# Patient Record
Sex: Male | Born: 1935 | Race: Black or African American | Hispanic: No | Smoking: Never smoker
Health system: Southern US, Community
[De-identification: ages and names within clinical notes are randomized; demographics above are authoritative.]

## PROBLEM LIST (undated history)

## (undated) DIAGNOSIS — C61 Malignant neoplasm of prostate: Secondary | ICD-10-CM

## (undated) DIAGNOSIS — I10 Essential (primary) hypertension: Secondary | ICD-10-CM

## (undated) DIAGNOSIS — Z8739 Personal history of other diseases of the musculoskeletal system and connective tissue: Secondary | ICD-10-CM

## (undated) DIAGNOSIS — I471 Supraventricular tachycardia, unspecified: Secondary | ICD-10-CM

## (undated) DIAGNOSIS — N189 Chronic kidney disease, unspecified: Secondary | ICD-10-CM

## (undated) DIAGNOSIS — I341 Nonrheumatic mitral (valve) prolapse: Secondary | ICD-10-CM

## (undated) DIAGNOSIS — D649 Anemia, unspecified: Secondary | ICD-10-CM

## (undated) DIAGNOSIS — C679 Malignant neoplasm of bladder, unspecified: Secondary | ICD-10-CM

## (undated) HISTORY — DX: Malignant neoplasm of prostate: C61

## (undated) HISTORY — DX: Nonrheumatic mitral (valve) prolapse: I34.1

## (undated) HISTORY — PX: HERNIA REPAIR: SHX51

## (undated) HISTORY — DX: Chronic kidney disease, unspecified: N18.9

## (undated) HISTORY — DX: Anemia, unspecified: D64.9

## (undated) HISTORY — DX: Malignant neoplasm of bladder, unspecified: C67.9

## (undated) HISTORY — DX: Personal history of other diseases of the musculoskeletal system and connective tissue: Z87.39

## (undated) HISTORY — DX: Supraventricular tachycardia: I47.1

## (undated) HISTORY — DX: Essential (primary) hypertension: I10

## (undated) HISTORY — PX: TRANSURETHRAL RESECTION OF PROSTATE: SHX73

## (undated) HISTORY — DX: Supraventricular tachycardia, unspecified: I47.10

## (undated) HISTORY — PX: OTHER SURGICAL HISTORY: SHX169

---

## 2003-06-20 ENCOUNTER — Ambulatory Visit (HOSPITAL_COMMUNITY): Admission: RE | Admit: 2003-06-20 | Discharge: 2003-06-20 | Payer: Self-pay | Admitting: Cardiology

## 2003-08-08 ENCOUNTER — Encounter (INDEPENDENT_AMBULATORY_CARE_PROVIDER_SITE_OTHER): Payer: Self-pay | Admitting: *Deleted

## 2003-08-08 ENCOUNTER — Ambulatory Visit (HOSPITAL_COMMUNITY): Admission: RE | Admit: 2003-08-08 | Discharge: 2003-08-09 | Payer: Self-pay | Admitting: General Surgery

## 2003-10-26 ENCOUNTER — Ambulatory Visit: Admission: RE | Admit: 2003-10-26 | Discharge: 2003-10-26 | Payer: Self-pay | Admitting: Cardiology

## 2005-09-03 ENCOUNTER — Inpatient Hospital Stay (HOSPITAL_COMMUNITY): Admission: EM | Admit: 2005-09-03 | Discharge: 2005-09-17 | Payer: Self-pay | Admitting: Emergency Medicine

## 2005-09-11 ENCOUNTER — Encounter (INDEPENDENT_AMBULATORY_CARE_PROVIDER_SITE_OTHER): Payer: Self-pay | Admitting: *Deleted

## 2006-01-19 ENCOUNTER — Emergency Department (HOSPITAL_COMMUNITY): Admission: EM | Admit: 2006-01-19 | Discharge: 2006-01-19 | Payer: Self-pay | Admitting: *Deleted

## 2006-01-23 ENCOUNTER — Encounter (INDEPENDENT_AMBULATORY_CARE_PROVIDER_SITE_OTHER): Payer: Self-pay | Admitting: Specialist

## 2006-01-24 ENCOUNTER — Inpatient Hospital Stay (HOSPITAL_COMMUNITY): Admission: RE | Admit: 2006-01-24 | Discharge: 2006-01-25 | Payer: Self-pay | Admitting: Urology

## 2006-04-08 ENCOUNTER — Ambulatory Visit (HOSPITAL_COMMUNITY): Admission: RE | Admit: 2006-04-08 | Discharge: 2006-04-08 | Payer: Self-pay | Admitting: Ophthalmology

## 2006-12-16 ENCOUNTER — Ambulatory Visit: Payer: Self-pay | Admitting: Cardiology

## 2006-12-16 ENCOUNTER — Encounter: Payer: Self-pay | Admitting: Urology

## 2006-12-16 ENCOUNTER — Inpatient Hospital Stay (HOSPITAL_COMMUNITY): Admission: AD | Admit: 2006-12-16 | Discharge: 2006-12-17 | Payer: Self-pay | Admitting: Cardiology

## 2006-12-17 ENCOUNTER — Encounter: Payer: Self-pay | Admitting: Cardiology

## 2007-04-09 ENCOUNTER — Encounter: Payer: Self-pay | Admitting: Urology

## 2007-04-09 ENCOUNTER — Ambulatory Visit (HOSPITAL_COMMUNITY): Admission: RE | Admit: 2007-04-09 | Discharge: 2007-04-10 | Payer: Self-pay | Admitting: Urology

## 2007-06-22 ENCOUNTER — Encounter (HOSPITAL_COMMUNITY): Admission: RE | Admit: 2007-06-22 | Discharge: 2007-07-01 | Payer: Self-pay | Admitting: Nephrology

## 2007-10-02 IMAGING — CR DG CHEST 1V PORT
1 series · 1 of 1 positions shown · non-contrast
Comparison: 08/04/03.

CLINICAL DATA: 82-year-old male, renal failure, swelling. 
 PORTABLE CHEST ? 1 VIEW:

[view not recorded]
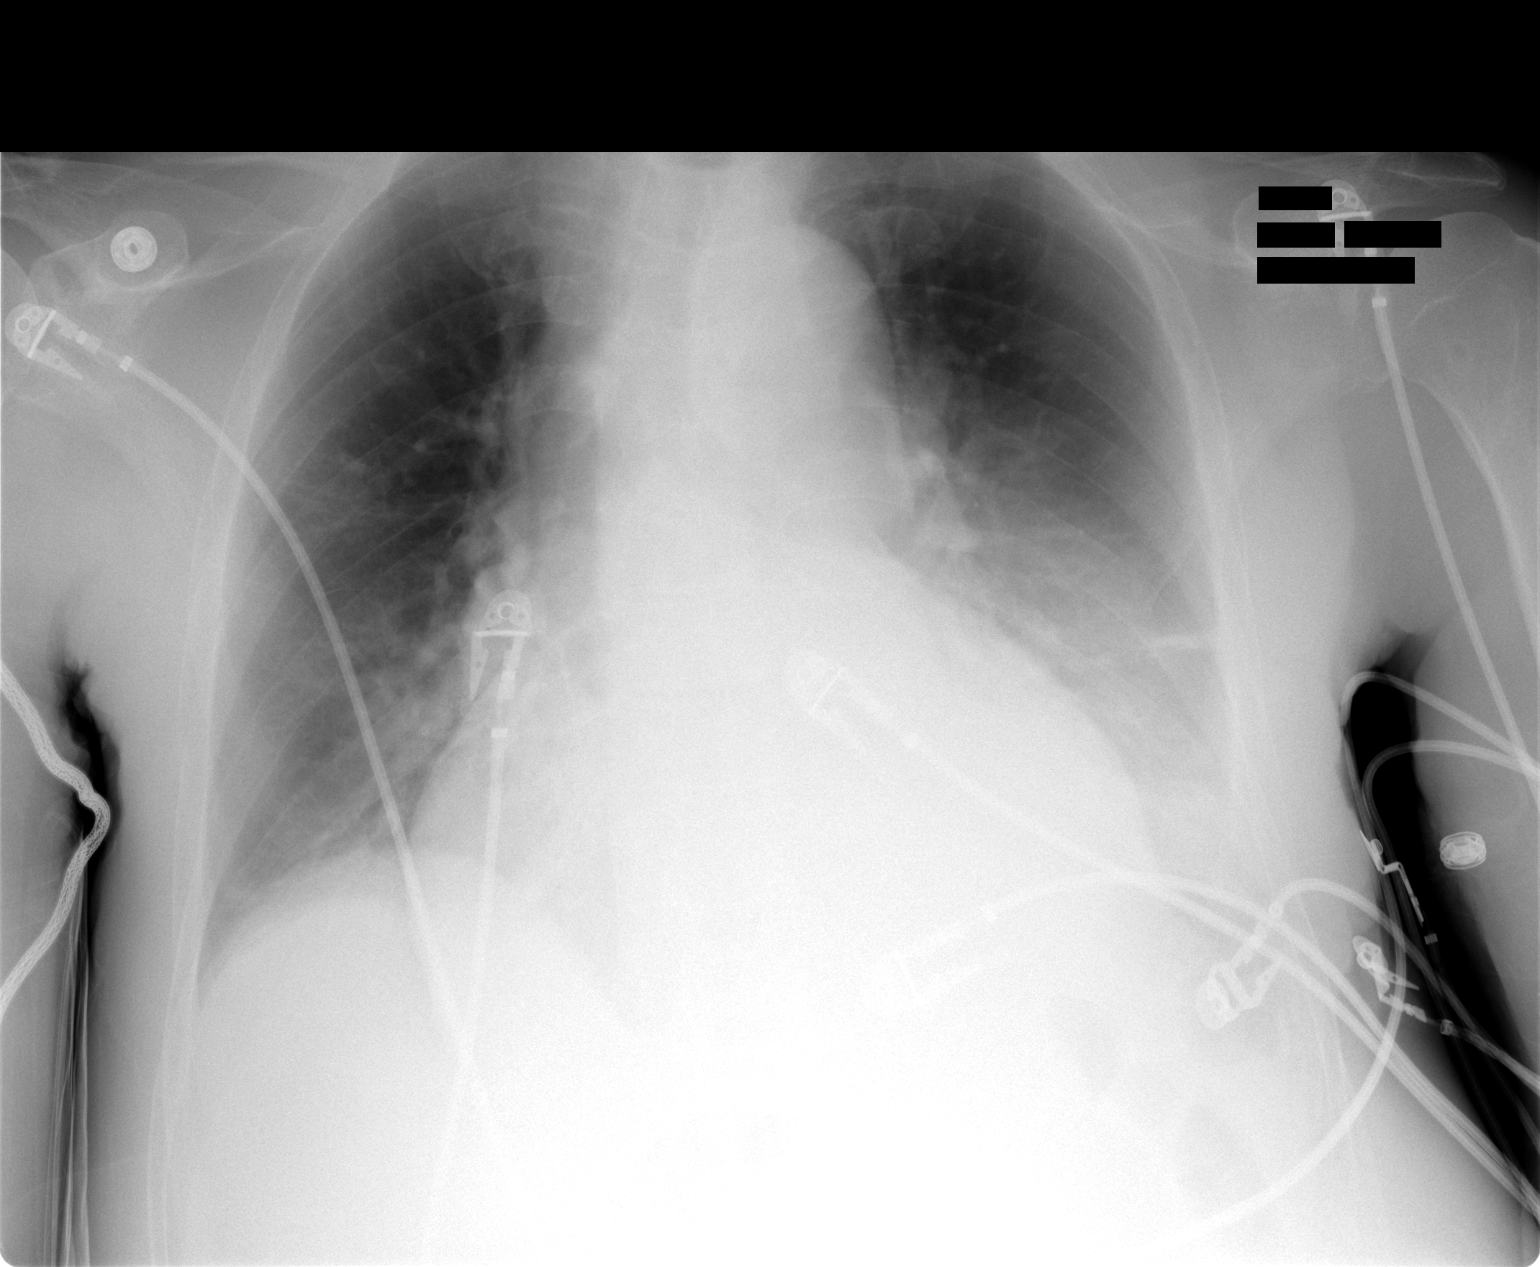

[1 of 1 positions shown; findings below may reference images not displayed]

FINDINGS: The heart is enlarged with central vascular congestion and left lower lobe atelectasis/consolidation.  Minimal right base atelectasis.  No significant edema, effusion, or pneumothorax.
IMPRESSION: 1.  Cardiomegaly with vascular congestion. 
 2.  Left lower lobe atelectasis/consolidation.  Pneumonia is not excluded.  
 3.  Right base atelectasis.

## 2008-05-27 ENCOUNTER — Ambulatory Visit (HOSPITAL_COMMUNITY): Admission: RE | Admit: 2008-05-27 | Discharge: 2008-05-27 | Payer: Self-pay | Admitting: Ophthalmology

## 2010-08-13 LAB — CBC
HCT: 37 % — ABNORMAL LOW (ref 39.0–52.0)
Hemoglobin: 12.5 g/dL — ABNORMAL LOW (ref 13.0–17.0)
MCHC: 33.8 g/dL (ref 30.0–36.0)
MCV: 96 fL (ref 78.0–100.0)
Platelets: 139 10*3/uL — ABNORMAL LOW (ref 150–400)
RDW: 12.6 % (ref 11.5–15.5)

## 2010-08-13 LAB — BASIC METABOLIC PANEL
BUN: 34 mg/dL — ABNORMAL HIGH (ref 6–23)
CO2: 33 mEq/L — ABNORMAL HIGH (ref 19–32)
Glucose, Bld: 109 mg/dL — ABNORMAL HIGH (ref 70–99)
Potassium: 3.9 mEq/L (ref 3.5–5.1)
Sodium: 140 mEq/L (ref 135–145)

## 2010-08-13 LAB — URINE MICROSCOPIC-ADD ON

## 2010-08-13 LAB — URINALYSIS, ROUTINE W REFLEX MICROSCOPIC
Bilirubin Urine: NEGATIVE
Hgb urine dipstick: NEGATIVE
Ketones, ur: NEGATIVE mg/dL
Protein, ur: NEGATIVE mg/dL
Urobilinogen, UA: 0.2 mg/dL (ref 0.0–1.0)

## 2010-09-11 NOTE — H&P (Signed)
Shawn Booker, Shawn Booker               ACCOUNT NO.:  [id number]   MEDICAL RECORD NO.:  [id number]          PATIENT TYPE:  INP   LOCATION:  2902                         FACILITY:  [facility]   PHYSICIAN:  Gerrit Friends. Dietrich Pates, MD, FACCDATE OF BIRTH:  Sep 23, 1922   DATE OF ADMISSION:  12/16/2006  DATE OF DISCHARGE:                              HISTORY & PHYSICAL   The patient is FULL CODE.   PRIMARY CARE PHYSICIAN:  Dr. Tawnya Crook.   The patient was a good historian.   TOTAL VISIT TIME:  Approximately 60 minutes.   CHIEF COMPLAINT:  Chest pressure and palpitations.   HISTORY OF PRESENT ILLNESS:  Shawn Booker is an 77 year old male with a  history of acute renal failure from bladder obstruction, related to  prostate cancer; history of recent bladder tumor diagnosis, and who  underwent transurethral resection of that tumor on the day of admission.   Presents for evaluation of palpitations and chest pressure on transfer  from [facility].  The patient apparently had a  transurethral prostatectomy for locally invasive adenocarcinoma in  September 2007, having a negative bone scan in August 2007.  Apparently  had a recent relook of the bladder and was found on biopsy apparently to  have a bladder tumor locally.  This was resected on the day of  admission, and postoperatively the patient notes that after 20-30  minutes after eating chest pressure syndrome, feeling like gas mid  sternum and with no radiation; but associated with tachy palpitations.  Notes when hooked up to the monitor his pulse read 139, but his blood  pressure was in the range of 120/70.  He denied any associated nausea or  vomiting, sweats or shortness of breath.  He denies any associated  presyncopal or syncopal symptoms.  The patient notes he felt this way  for approximately 2 hrs.  The patient noted in the emergency room by EKG  to have a tachycardia of 120 beats per minute.  Apparently his heart  rate  decreased into the 70s range spontaneously without any intervention  in the emergency room.   The patient denies having any episodes of this in the past.  He denies  any prior chest pain.  He denies any recent lower extremity edema.  He  denies any paroxysmal nocturnal dyspnea or orthopnea.   PAST MEDICAL HISTORY:  No prior cardiac catheterization.  No prior  coronary artery bypass grafting.  He had a Myoview perfusion spec study,  with a Persantine stress in June 20, 2003; showing ejection fraction  of 59% and no evidence of ischemia.  He has a history of right upper  lung scar by chest x-ray.  History of iron deficiency anemia and  thrombocytopenia; possibly related to chronic kidney disease.  With no  recent thrombocytopenia or anemia.  History of acute renal failure, with  bilateral hydronephrosis, anasarca and related to urethral obstruction  in May 2007.  He has a history of hypertension.  History of a 2/6  holosystolic murmur per old chart.  History of osteopenia.  History of  volume overload.  History of  right eye cataract surgery in December  2007.   ALLERGIES:  HE HAS NO MEDICINE ALLERGIES.  He does not allergies by  testing to MILK, WHEAT, EGGS; but has never had a clinical reaction to  it.  NO CONTRAST ALLERGY.   MEDICATIONS:  1. Procrit injection every 3 weeks.  2. Zemplar 1 mcg daily.  3. Lasix 80 mg by mouth twice a day.  4. Nephrovite daily.  5. Casodex 60 mg by mouth daily.  6. Ultimate meal supplementations daily with meals.  7. Vitamin and calcium supplements.  8. Herbal plant algae 10 days out of the week.  9. Vitamin C 1000 mg every other day.  10.Grape seed 100 mg by mouth twice daily.  11.Just prescribed Keflex postoperatively 500 mg by mouth 3x daily.   SOCIAL HISTORY:  He lives alone.  He is divorced.  A former Runner, broadcasting/film/video in  Psychologist, educational and music.  No history of tobacco ever, or alcohol or illicit drugs.  He is a vegetarian.   FAMILY HISTORY:  He denies any  chronic medical problems in the mother  and the father.  Mother died at 62 and father died at 8.  He has 2  siblings; one brother is 90 and one sister is 33 with no chronic medical  problems.   REVIEW OF SYSTEMS:  CONSTITUTIONAL:  He denies any fevers, with no  history of sweats in the past after his prostate surgery.  HEENT:  He  denies any headaches, sore throat, or nasal discharge.  SKIN:  He denies  any rashes or lesions or nail changes.  CARDIOPULMONARY:  As above.  He  denies any cough, wheezing or claudication.  GU:  He denies any dysuria  or frequency or urgent prior.  He notes after the prostate surgery in  September 2007 he has had no trouble.  NEURO/PSYCHIATRIC:  He denies any  weakness, numbness or mood disturbance. He also denies any joint  swelling or deformities.  GI:  He denies any nausea or vomiting.  He  denies any bright red blood per rectum or melena.  ENDOCRINE:  He does  not polydipsia but no polyuria.   PHYSICAL EXAMINATION:  VITAL SIGNS:  Temperature afebrile with a pulse  of 74, respiratory rate 64, blood pressure 116/74, saturation 100% on 2  liters nasal cannula.  Weight 77 kg.  GENERAL:  He is a male lying flat in bed; looking younger than his  stated age and in no acute distress.  HEENT:  Normocephalic and atraumatic.  Pupils are equal, round and  reactive to light.  Extraocular movements are intact.  Sclerae is clear.  The oropharynx is dry.  Good dentition.  NECK:  Supple, with no lymphadenopathy, thyromegaly or bruits.  Jugular  venous distention is estimated at 7 cm of water.  No anterior cervical  lymphadenopathy.  CARDIOVASCULAR:  Regular rate and rhythm with normal S1 and S2.  He had  a 3/6 holosystolic murmur, loudest at the apex and at the left sternum;  with no changes in intensity of the murmur with inspiration or with  Valsalva maneuvers.  LUNGS:  Clear to auscultation bilaterally.  SKIN:  Showed no rashes or lesions.  ABDOMEN:  Soft and  nontender, nondistended.  No hepatosplenomegaly.  GU:  He has a Foley in place, with serosanguineous urine.  EXTREMITIES:  Show no clubbing, cyanosis or edema.  MUSCULOSKELETAL:  Shows no joint deformities or effusion.  NEUROLOGIC:  He is alert and oriented x4.  Cranial nerves  II-XII are  grossly intact.  Strength and sensation grossly intact.   CHEST X-RAY:  Shows mild cardiomegaly with possible left  hemidiaphragmatic free air.   EKG:  At the time in the emergency room showed a possible  supraventricular tachycardia at the rate of 120.  QRS interval was 92.  QT corrected to 472.  This is new compared to sinus bradycardia that was  evident in August 04, 2003 at a rate of 45.   LABORATORY DATA:  White count 4.1, hemoglobin 12.5, hematocrit 37,  platelets 270.  MCV 95.6.  Sodium 136, potassium 4.3, chloride 101,  bicarb 28, BUN 35, creatinine 2.35 (his baseline was in the range of  2.4), glucose 269 (which is highest reported here at [facility]).  Total  bilirubin 1.1, AST 30, ALT 14, alkaline phosphatase 93.  Total protein  7.4, albumin 3.6, calcium 8.4.  Cardiac markers are 1545 on day of  admission.  CK 129, MB 2.8.  Troponin 0.02.  Venous blood gas:  pH 7.35,  CO2  52.9.   ASSESSMENT AND PLAN:  This is a gentleman with a history of bladder  tumor, postoperatively had an apparent supraventricular tachycardia  episode, and has elevated blood sugars.  Further supraventricular  tachycardia episodes differential diagnoses include:  catecholamine  induced; although this is relatively early.  Or, he did receive  apparently one liter of D5W, which possibly resulted in his elevated  blood sugar.  Unsure if this relates to his arrhythmia.  Other potential  possibilities include pulmonary thromboembolism; although he has no  signs on physical examination or objectively to have this syndrome.  Will also keep coronary syndrome a possibility; also hyperthyroidism  could be a potential  possibility.  For these possibilities we will get  and echocardiogram.  He will be on telemetry.  We will get cardiac  markers x3; and check a TSH, free T4, B-natruretic peptide, magnesium  and a D-dimer.  He will be n.p.o. after midnight, and he will be placed  on aspirin and a low-dose by mouth beta blocker.   For his elevated blood sugars we will follow this with AccuCheks and  check a hemoglobin A1c.  Possibly related to the IV infusions given for  possible mitral regurgitation or murmur.   Will check an echocardiogram in the morning.  He has no apparent  symptoms or longstanding shortness of breath, dyspnea on exertion.   For his recent bladder surgery, we will start the Keflex here and get a  urinalysis and culture -- given his recent SVT.  This may be a sign of  infection.  The Foley is to be removed tomorrow.   For his chronic kidney disease will continue Nephrovite and Lasix.   For his prostate cancer will continue Casodex, GI prophylaxis with  Protonix and DVT prophylaxis with pneumatic compression devices.      Darryl D. Prime, MD   Electronically Signed     ______________________________  Gerrit Friends. Dietrich Pates, MD, [facility]    DDP/MEDQ  D:  12/16/2006  T:  12/17/2006  Job:  [id number]

## 2010-09-11 NOTE — Op Note (Signed)
NAMEDUFF, POZZI               ACCOUNT NO.:  [id number]   MEDICAL RECORD NO.:  [id number]          PATIENT TYPE:  OIB   LOCATION:  1413                         FACILITY:  [facility]   PHYSICIAN:  Terie Purser, MD         DATE OF BIRTH:  Dec 01, 1922   DATE OF PROCEDURE:  04/09/2007  DATE OF DISCHARGE:                               OPERATIVE REPORT   ASSISTANT:  Dr. Allena Katz.   PREOPERATIVE DIAGNOSIS:  Recurrent bladder tumor.   POSTOPERATIVE DIAGNOSIS:  Recurrent bladder tumor.   PROCEDURES:  1. Cystourethroscopy.  2. Urethral dilation.  3. Transurethral resection of bladder tumor (1).   INDICATIONS:  This is an 77 year old gentleman with history of prostate  cancer status post radiation who was found to have a bladder tumor.  This was resected in the past and the patient deferred re-resection at  that time.  On serial cystoscopy was found to have recurrence of bladder  tumor in his right lateral wall and was scheduled today electively for  transurethral resection.   PROCEDURE IN DETAIL:  The patient was brought back in the operating  room.  He was placed in dorsal lithotomy position after the successful  induction of LMA anesthetic.  All pressure points were padded  appropriately and he was prepped and draped in the usual sterile  fashion.  Using a 22-French sheath and 12.5 and 70 degree lenses pan  cystourethroscopy was performed.  The patient's anterior urethra  appeared normal.  The patient's membranous urethra appeared normal.  On  viewing the patient's prostate we could see blanching mucosa consistent  with his history of radiation.  Additionally he had a false pass just  distal to the verumontanum.  The rest of the prostate appeared normal,  on entering his bladder pancystoscopy was performed.  The patient's  bladder had evidence of hypertrophy with cellulae and small diverticula.  Both ureteral orifices were seen and an old scar was seen in the right  resection bed.  Just  superior to this on the right lateral wall of the  bladder a 1 cm bladder tumor was seen.   The cystoscope was removed and we dilated the urethra to 28-French  sequentially from a 22-French with curved Mayo dilators.   Then we inserted the resectoscope under direct vision.  Using the loop  ono cut we took a swipe of the bladder tumor and removed it in its  entirety.  Muscle fibers could be seen in the bed and there was no  evidence of perforation of the bladder.  At this point we coagulated the  edges of the resection bed and the resection site itself to ensure that  there is no bleeding.  The bladder was emptied and we re-examined the  site and there was again no evidence of bleeding.  Both ureteral  orifices were seen in their normal position effluxing urine.  The rest  of the bladder appeared normal.  At this point the resectoscope was  removed.  A 22-French catheter was placed.  Clear urine was seen  emanating from this catheter.  Balloon was  inflated and this ended the  procedure.  Please note that Dr. Annabell Howells was present throughout the  entirety of the case.   ESTIMATED BLOOD LOSS:  Minimal.   URINE OUTPUT:  Unrecorded.   DRAINS:  Foley catheter.   SPECIMENS:  Bladder tumor.   COMPLICATIONS:  None apparent.   DISPOSITION:  The patient will go to PACU for further care.      Terie Purser, MD     JH/MEDQ  D:  04/09/2007  T:  04/09/2007  Job:  [id number]

## 2010-09-11 NOTE — Op Note (Signed)
NAMEHILMAR, MOLDOVAN               ACCOUNT NO.:  [id number]   MEDICAL RECORD NO.:  [id number]          PATIENT TYPE:  AMB   LOCATION:  NESC                         [facility]ACILITY:  [facility]   PHYSICIAN:  Excell Seltzer. Annabell Howells, M.D.    DATE OF BIRTH:  1922-12-05   DATE OF PROCEDURE:  12/16/2006  DATE OF DISCHARGE:                               OPERATIVE REPORT   PROCEDURE:  Cystoscopy with transurethral resection of a medium size  bladder tumor.   PREOPERATIVE DIAGNOSIS:  Bladder tumor, right bladder neck.   POSTOPERATIVE DIAGNOSIS:  Bladder tumor, right bladder neck.   SURGEON:  Excell Seltzer. Annabell Howells, M.D.   ANESTHESIA:  General.   SPECIMEN:  Bladder tumor tissue and the small prostate nodule.   DRAINS:  20-French Foley catheter.   COMPLICATIONS:  None.   INDICATIONS:  Mr. Sherrod is an 77 year old black male with a history of  stage D adenocarcinoma of prostate with a rising PSA despite androgen  ablation therapy.  He was recently found to have hematuria and on  evaluation was found to have approximately 3-4 cm bladder tumor at the  right bladder neck.  It was felt that TURBT was indicated.   FINDINGS AND PROCEDURE:  The patient was taken operating room.  He was  given Cipro 200 mg IV was fitted with PAS hose.  A general anesthetic  was induced.  He was placed in lithotomy position.  His perineum and  genitalia were prepped Betadine solution and he was draped in the usual  sterile fashion.  Cystoscopy was performed using a 22-French scope and  12 and 70 degrees lenses.  Examination revealed a normal urethra.  The  external sphincter was intact.  The prostatic urethra had evidence of  prior TURP with a widely patent prostatic urethra there was a small  amount of nodular prostate regrowth at the right bladder neck.  Examination of bladder revealed moderate trabeculation with a 3 cm  papillary bladder tumor at the right bladder neck.  No other tumors or  mucosal abnormalities were noted on thorough  inspection of bladder.  The  ureteral orifices were unremarkable.   Once cystoscopy been completed, a 28-French continuous flow resectoscope  sheath was inserted per urethra without difficulty.  This was fitted  with an [place] handle with 12 degrees lens and a loop.  The tumor was  identified and was resected on pure cut and the tissue around the base  of the tumor was widely fulgurated.  The bladder tumor chips were then  evacuated from the bladder.  At  this point I resected some small area  of nodular prostate regrowth at the bladder neck.  This only took one  cut to remove the target tissue.   At this point of the bladder and prostate were reinspected to ensure  hemostasis and complete evacuation of chips.  No bleeding was noted.  No  retained material was noted.  Ureteral orifices were unremarkable.  The  resectoscope was then removed and a 20-French Foley catheter was  inserted.  The balloon was filled with 10 mL sterile fluid.  The  catheter irrigant drained clear urine.  The catheter was placed to  straight drainage.  The drapes removed.  The patient was taken down from  the lithotomy position.  His anesthetic was reversed and he was moved to  the recovery room in stable condition.  There were no complications.      Excell Seltzer. Annabell Howells, M.D.  Electronically Signed     JJW/MEDQ  D:  12/16/2006  T:  12/16/2006  Job:  [id number]   cc:   Osvaldo Shipper. Spruill, M.D.  Fax: [phone]

## 2010-09-11 NOTE — Discharge Summary (Signed)
Shawn Booker, Shawn Booker               ACCOUNT NO.:  [id number]   MEDICAL RECORD NO.:  [id number]          PATIENT TYPE:  INP   LOCATION:  2902                         FACILITY:  [facility]   PHYSICIAN:  Bevelyn Buckles. Bensimhon, MDDATE OF BIRTH:  [birth year]   DATE OF ADMISSION:  12/16/2006  DATE OF DISCHARGE:  12/17/2006                               DISCHARGE SUMMARY   PRIMARY CARDIOLOGIST:  Osvaldo Shipper. Spruill, M.D.   UROLOGISTExcell Seltzer. Annabell Howells, M.D.   RENAL:  [facility].   DISCHARGING DIAGNOSES:  1. AV nodal reentry tachycardia.  2. Status post cystoscopy with transurethral resection of a medium      size bladder tumor on December 16, 2006.   PAST MEDICAL HISTORY:  1. Acute on chronic renal failure secondary to bladder obstruction      related to prostate cancer.  2. History of recent bladder tumor diagnosis, status post      transurethral resection 2007.  3. Status post stress Myoview in 2005, showed EF 59%, no ischemia.  4. Iron deficiency anemia.  5. History of osteopenia.   HOSPITAL COURSE:  [name] is a very pleasant 77 year old African  American gentleman who presented with complaints of palpitations and  chest pressure.  The patient was noted to have EKG, tachycardia of 120  beats per minute.  Apparently the patient's heart rate decreased into  the 70s spontaneously without any intervention in the ER.  The patient  was stable with a blood pressure 116/74. Chest x-ray showed mild  cardiomegaly with possible left hemidiaphragmatic free air.  EKG showed  questionable supraventricular tachycardia at a rate of 120.  The patient  was admitted for observation.  Ruled out for MI.  A 2-D echocardiogram  was obtained.  He was started on a low-dose beta blocker.  The patient  was also noted to have elevated blood sugars.  Hemoglobin A1c of 5.7  this admission.  Dr. Gala Romney reviewed the patient's EKGs, felt rhythm  AV node reentry tachycardia with recommendations for metoprolol  p.r.n.  use at home.  Patient being discharged home to follow up with Dr.  Shana Chute with recommendations for a stress Myoview which can be done  outpatient.  Results of 2-D echocardiogram pending at this time.  The  patient has previously been given discharge instructions by Dr. Annabell Howells  with a prescription for his Keflex 500 mg one p.o. t.i.d. along with  home care instructions following his cystoscopy.   DISCHARGE MEDICATIONS:  The patient is to resume his previous  medications including  1. Nephro-Vite 1 tablet daily.  2. Aspirin 81 daily.  3. Casodex 50 mg daily.  4. Zemplar 1 mcg daily.  5. Furosemide 80 mg b.i.d.  6. I have given him a prescription for metoprolol 25 mg to use p.r.n.      The patient is to notify Dr. Shana Chute if he is having to use the      metoprolol frequently.  Otherwise it will be ordered 1 tablet      q.12h. as needed.   The patient can be discharged home today after Foley catheter  has been  discontinued as previously ordered.  The patient is aware if he has not  voided within 8 hours, he needs to return to the emergency room for  reevaluation.  The patient also agrees to call Dr. Shana Chute and Dr. Annabell Howells  for follow-up appointment.   DURATION OF DISCHARGE ENCOUNTER:  Approximately 30 minutes.      Dorian Pod, ACNP      Bevelyn Buckles. Bensimhon, MD  Electronically Signed    MB/MEDQ  D:  12/17/2006  T:  12/18/2006  Job:  [id number]   cc:   Excell Seltzer. Annabell Howells, M.D.  Osvaldo Shipper. Spruill, M.D.

## 2010-09-14 NOTE — Discharge Summary (Signed)
NAMEBECKER, CHRISTOPHER               ACCOUNT NO.:  [id number]   MEDICAL RECORD NO.:  [id number]          PATIENT TYPE:  INP   LOCATION:  5523                         FACILITY:  [facility]   PHYSICIAN:  Sanjuana Mae, MDDATE OF BIRTH:  11/10/1922   DATE OF ADMISSION:  DATE OF DISCHARGE:  09/17/2005                                 DISCHARGE SUMMARY   ADMITTING DIAGNOSES:  1.  Urinary tract obstruction with no history of acute renal failure.  2.  Anasarca.  3.  Hyperkalemia.  4.  Anemia.  5.  Uremia.  6.  Confusion secondary to number 5.  7.  Hypertension.  8.  History of inguinal hernia repair.   DISCHARGE DIAGNOSES:  1.  Urinary tract obstruction secondary to prostate cancer.  2.  Acute renal failure, resolving.  3.  Iron-deficiency anemia.  4.  Hypertension.  5.  Phimosis status post dorsal slit.  6.  Thrombocytopenia, now resolved.   CONSULTS:  Dr. Bjorn Pippin on Sep 03, 2005.   PROCEDURES:  1.  Hemodialysis x3 treatments.  2.  Cystoscopy on Sep 03, 2005 be Dr. Bjorn Pippin.  3.  Transfusion of 7 units of packed RBCs over a 5-day period.  4.  Right femoral temporary dialysis catheter placed by Dr. Darrick Penna on Sep 03, 2005, and removed on Sep 07, 2005.  5.  Cystoscopy with prostate ultrasound/biopsy and dorsal slit on Sep 12, 2005 by Dr. Annabell Howells.   BREIF HISTORY:  Mr. Bowman Higbie is an 77 year old black gentleman with no  significant past medical history who presented on Sep 02, 2005 to the [facility] emergency room due to progressive lower extremity and scrotal swelling,  severe weakness, decline in mental status and shortness of breath.  Due to  his volume excess, he required IV diuresis, and due to his hyperkalemia he  needed emergent hemodialysis.  Therefore, he was admitted.   HOSPITAL COURSE:  1.  Urinary tract obstruction:  A renal ultrasound was performed on Sep 03, 2005, and his kidneys were found to be echodense with bilateral      hydronephrosis.   The emergency room staff had a difficult time placing a      Foley catheter.  Dr. Annabell Howells placed one successfully with immediate      drainage of 4 liters of urine.  Dr. Annabell Howells kept the Foley in place for      long-term drainage.  He also checked a PSA, and started Avodart.  Dr.      Darrick Penna placed a temporary femoral dialysis catheter for emergent      hemodialysis, for which he received 3 hours worth of treatment and a 0K      bath for the first 2 hours, and a 2K for the remaining hour.  His serum      creatinine on the day of admission was 16.8, BUN 131, potassium 8.7,      chloride 115, bicarb 16, sodium 136.  He was aggressively diuresed with      good  success for the remainder of the hospitalization with a creatinine      of 4.5 on the day of discharge.  A PSA was checked on day of admission,      and was found to be 62.84.  This was rechecked on May 18, and noted      still to be high at 63.53.  Cystoscopy and ultrasound of the prostate as      well as biopsy occurred on May 16.  Pathology returned as high-grade      prostate cancer.  Bone scan was negative for metastases Upon initial      drainage of his bladder, Mr. Mczeal urine appeared to be pyuric with      positive nitrates.  Therefore, he was treated empirically with Cipro 500      mg 1 tablet each day for 7 days.  However, the culture returned negative      for growth.  2.  Anemia:  Hemoglobin on day of admission was found to be 7.9.  Over a 5-      day period he received 7 units of packed RBCs.  There were no signs or      symptoms of GI bleeding.  Huge blood loss felt secondary to hematuria      and renal failure.  Iron stores were checked on May 18, and were found      to be low at 15% saturation, TIBC 206, ferritin 511.  However, on day of      admission, his iron stores were normal except for a low TIBC of 212.  He      was given ferrous sulfate p.o.  3.  Acute renal failure:  As above in hospital course number 1.  He  did      receive 3 treatments of hemodialysis.  His temporary catheter that was      placed on May 8th was removed on May 12th after it was felt that his      kidney function would continue to improve with diuresis.  An intact      parathyroid level was drawn, and was found to be at 79.5.  He was not      given vitamin D during this hospitalization; however, due to a high      serum phosphorus was given calcium binders with meals.  4.  Hypertension:  Blood pressure upon admission was high at 167/102.      Patient's family stated that he did not have a history of hypertension.      He was not on antihypertensives before this hospitalization.  His blood      pressure, however, slowly decreased with aggressive diuresis, and he was      discharged on no antihypertensives.  5.  Phimosis:  Status post doral slit.  Underwent dorsal slit as stated      above.  He tolerated the procedure well.  6.  Thrombocytopenia:  Platelets on date of admission were 251,000.      However, slowly trended down, and roughly trended back up to normal      before discharge.  Platelets got as low as 112,000.  On day of discharge      there were 274,000.   DISPOSITION:  Mr. Delapena was discharged to his home with family in improved  condition.   DISCHARGE MEDICATIONS:  1.  Casodex 50 mg p.o. q.day.  2.  Colace 100 mg p.o. q.day p.r.n.  3.  Nephrovite  1 tablet q.day.  4.  Os-Cal 500 mg 1 tablet with each meal.  5.  Furosemide 40 mg p.o. q.day.  6.  Ferrous sulfate 325 mg 1 tablet p.o. b.i.d.   He was discharged with a Foley catheter in place.  [facility] were  asked to assist with the care of the catheter.  Dr. Annabell Howells wanted Mr. Carrizales  to followup in 1-2 weeks post discharge at a follow-up appointment at  [facility] with me on Thursday, Sep 19, 2005.  His activity  level was to increase as tolerated.      Hillery Hunter, PA   ______________________________  Sanjuana Mae, MD    MJG/MEDQ  D:  11/27/2005  T:  11/27/2005  Job:  [id number]   cc:   Sanjuana Mae, MD  Excell Seltzer. Annabell Howells, M.D.  [facility]

## 2010-09-14 NOTE — Consult Note (Signed)
Shawn Booker, Shawn Booker               ACCOUNT NO.:  [id number]   MEDICAL RECORD NO.:  [id number]          PATIENT TYPE:  INP   LOCATION:  2105                         FACILITY:  [facility]   PHYSICIAN:  Excell Seltzer. Annabell Howells, M.D.    DATE OF BIRTH:  July 16, 1922   DATE OF CONSULTATION:  09/03/2005  DATE OF DISCHARGE:                                   CONSULTATION   CHIEF COMPLAINT:  Scrotal swelling.   HISTORY:  Shawn Booker is an 77 year old black male who has had a several-week  history of difficulty voiding and now presents to the emergency room with  anasarca and acute renal failure with a creatinine of 17 and a potassium of  8.7.  The ER staff was unable to place Foley due to the patient's severe  penile edema.  The patient has questionable history of urethral problem as a  child but no other GU history.   PAST HISTORY:  Pertinent for no drug allergies.   CURRENT MEDICATIONS:  Include only herbs and supplements.   MEDICAL AND SURGICAL HISTORY:  Pertinent for prior inguinal hernia repair.   SOCIAL HISTORY:  Negative for tobacco or alcohol.   FAMILY HISTORY:  Noncontributory.   REVIEW OF SYSTEMS:  The patient has diffuse edema and urinary retention but  is confused and unable to provide much of a review of systems, but he  otherwise seems to be without complaints.   EXAMINATION:  VITAL SIGNS:  Temperature is 97.9, pulse is 80, respirations  21, blood pressure 157/100.  GENERAL:  He is an elderly, well-developed, well-nourished black male with  lethargy and confusion.  HEENT:  Normocephalic, atraumatic.  LUNGS:  Normal effort.  HEART:  Regular rate and rhythm.  ABDOMEN:  Markedly distended with a suprapubic mass palpable to above the  umbilicus.  There is mild tenderness on palpation.  No hernias or inguinal  adenopathy are noted.  GENITOURINARY:  Reveals an uncircumcised phallus with marked penis/scrotal  edema with phimosis and the meatus is not visualizable.  The testicles are  difficult to palpate because of the scrotal edema.  Anus and perineum  without lesions.  RECTAL:  Reveals normal sphincter tone.  Prostate is 3-4+ enlarged, smooth  and firm, without distinct nodules.  Seminal vesicles are not palpable.  No  rectal masses are noted.  EXTREMITIES:  Demonstrate extensive edema consistent with his diffuse  anasarca.  Skin has an orange-peel-type appearance.  NEUROLOGIC:  As noted, neurologically he is confused and lethargic.   PROCEDURE:  The patient's penis was prepped with Betadine solution.  He was  draped with a sterile towel.  Cystoscopy was performed using a 16-French  flexible scope.  I was able to visualize the urethral meatus and cannulate  it.  There was some blood within the urethral meatus suggestive of catheter  trauma.  The external sphincter is intact.  The prostate urethra is not well  visualized but appeared elongated and obstructing.  The bladder was entered  but not readily visualized due to its massive dilation.   A guidewire was passed through the cystoscope and then the cystoscope  was  removed.   A 16-French Councill-tip catheter was passed over the guidewire into the  bladder without significant difficulty.  The balloon was filled with 10 mL  of sterile fluid.  The guidewire was removed and the catheter was hooked to  a drainage bag.   The patient drained in excess of 4 L.  There were no complications during  the procedure.   IMPRESSION:  1.  Urinary retention secondary to benign prostatic hypertrophy versus      prostate cancer.  2.  Acute renal failure with hyperkalemia and anasarca.   PLAN:  He will need Foley catheter drainage for a prolonged period of time.  We need to check a PSA and he will need reevaluation after recovery from  renal insufficiency.  We could go ahead and start Avodart 0.5 mg daily to  try and reduce the size of his prostate gland.      Excell Seltzer. Annabell Howells, M.D.  Electronically Signed     JJW/MEDQ   D:  09/03/2005  T:  09/03/2005  Job:  [id number]

## 2010-09-14 NOTE — Op Note (Signed)
NAMEJEREMIAS, BROYHILL               ACCOUNT NO.:  [id number]   MEDICAL RECORD NO.:  [id number]          PATIENT TYPE:  INP   LOCATION:  1432                         FACILITY:  [facility]   PHYSICIAN:  Excell Seltzer. Annabell Howells, M.D.    DATE OF BIRTH:  1922/10/05   DATE OF PROCEDURE:  01/23/2006  DATE OF DISCHARGE:                                 OPERATIVE REPORT   PROCEDURE:  TURP.   PREOPERATIVE DIAGNOSIS:  Prostate cancer, with outlet obstruction and  urinary retention.   POSTOPERATIVE DIAGNOSIS:  Prostate cancer, with outlet obstruction and  urinary retention.   SURGEON:  Excell Seltzer. Annabell Howells, M.D.   ANESTHESIA:  General.   SPECIMEN:  Prostate chips.   DRAIN:  Foley catheter.   COMPLICATIONS:  None.   INDICATIONS:  Mr. Dershem is an 77 year old black male who has a history of  locally advanced adenocarcinoma of the prostate.  He has been placed on  Casodex and Viadur, with good response to PSA, but he has had persistent  urinary retention since diagnosis. He also had some renal insufficiency  related to his outlet obstruction.  He now is to undergo TURP to see if we  can relieve his outlet obstruction.   FINDINGS AND PROCEDURE:  The patient was taken to the operating room, where  general anesthetic was induced.  He was placed in lithotomy position.  His  perineum and genitalia were prepped with Betadine solution.  He was draped  in the usual sterile fashion.  Cystoscopy was performed using a 22-French  scope and 12 and 70-degrees lenses.  A 28-French continuous flow  resectoscope sheath was inserted.  This was fitted Wandra Scot  handle and a 12-  degrees lens and the 26 loop.  Inspection revealed an elongated obstructing  prostate, with moderate trabeculation of the bladder wall.  The ureteral  orifices were away from bladder neck.   The bladder neck was resected from bladder neck to apex.  The floor was  resected out to the verumontanum bilaterally.  The left lobe of the prostate  was then  resected from bladder neck to apex out to the capsular fibers.  This was then repeated on the right side. Residual apical and anterior  tissue was then resected, and the chips were evacuated. Some additional  resection was performed after the chips were evacuated to smooth some areas  and control bleeding.  Once this was completed, the bladder was once again  evacuated free of chips, and the resectoscope was removed.  Pressure on the  bladder produced a good stream.  A 22-French three-way Foley catheter was  inserted with the aid of a catheter guide.  The balloon was then filled with  30 cc of sterile fluid and placed on traction.  The urine efflux remained  quite bloody.  An additional 15 cc was placed in the balloon, but once again  I could not clear the bleeding.  The Foley was then removed, and  reinspection was performed with the rectoscope.  An active bleeder was noted  at the right bladder neck.  This was fulgurated, with good clearance  of the  irrigant.  At this point, the Foley catheter was reinserted, 30 cc was  placed in the balloon, and the catheter was irrigated.  Irrigant  returned clear.  It was placed to continuous irrigation and straight  drainage and secured to the patient's leg with a catheter strap. The patient  was taken down from the lithotomy position.  His anesthetic was reversed.  He was moved to the recovery room in stable condition.  There were no  complications.      Excell Seltzer. Annabell Howells, M.D.  Electronically Signed     JJW/MEDQ  D:  01/23/2006  T:  01/24/2006  Job:  [id number]   cc:   Osvaldo Shipper. Spruill, M.D.  Fax: [phone]   Cecille Aver, M.D.  Fax: [phone]

## 2010-09-14 NOTE — Op Note (Signed)
NAMERUCKER, PRIDGEON                           ACCOUNT NO.:  [id number]   MEDICAL RECORD NO.:  [id number]                   PATIENT TYPE:  OIB   LOCATION:  6708                                 FACILITY:  [facility]   PHYSICIAN:  Leonie Man, M.D.                DATE OF BIRTH:  [birth year]   DATE OF PROCEDURE:  08/08/2003  DATE OF DISCHARGE:  08/09/2003                                 OPERATIVE REPORT   PREOPERATIVE DIAGNOSIS:  Bilateral inguinal hernias.   POSTOPERATIVE DIAGNOSES:  1. Right sliding inguinal hernia with appendix.  2. Left indirect inguinal hernia.   PROCEDURES:  1. Repair of right sliding inguinal hernia with appendectomy.  2. Repair of left indirect inguinal hernia.   SURGEON:  Leonie Man, M.D.   ASSISTANT:  Nurse.   ANESTHESIA:  General.   INDICATIONS FOR PROCEDURE:  The patient is a 77 year old man who works as a  [employer] and has had a long-standing right sided inguinal scrotal hernia  with bowel going down into his right scrotum over a prolonged period of  time.  Because he has been having some difficulty due to pain and  discomfort, he presented with this hernia.  On evaluation he was also noted  to have a left sided inguinal hernia.  He comes to the operating room now  after the risks and potential benefits of surgery had been fully discussed,  all questions answered and consent obtained.   DESCRIPTION OF PROCEDURE:  Following the induction of satisfactory general  anesthesia with the patient positioned supinely the lower abdomen was  prepped and draped to be included in the sterile operative field.  The large  right sided inguinal hernia was first approached by making a transverse  incision in the lower abdominal crease, taking this through the skin and  subcutaneous tissue and carrying the dissection down to the external oblique  aponeurosis.  The external oblique aponeurosis was opened up through the  external inguinal ring.  The hernia along  with the spermatic cord were  elevated and held with a Penrose drain.  The hernial sac was then dissected  free and open and carrying the dissection down into the scrotum to remove  the entire hernia sac and carrying the dissection up along dissecting the  sac away from the spermatic cord up towards the internal ring.  Upon opening  the sac, it was immediately noted that a portion of a wall of this sac was  created by his appendix.  The dissection was then carried up into the hernia  sac and the cecum delivered into the hernia.  The mesoappendix was taken  between clamps and secured with ties of 2-0 silk.  The appendix was then  amputated at its base using a GIA stapling device.  The portion of the wall  of this sac that had contained appendix was then dissected free and the  appendix was  removed and forwarded for pathologic evaluation.  The cecum was  then allowed to retract back into the peritoneal cavity and the redundant  sac was amputated.  The remainder of the sac was closed with a running  suture of 2-0 Vicryl.  The defect in the internal ring and in Hesselbach's  triangle was then closed by an onlay patch of polypropylene mesh which was  sewn in at the pubic tubercle, carried up along the conjoined tendon to the  internal ring and again from the pubic tubercle up along the shelving edge  of Poupart's ligament to the internal ring.  The tails of the mesh were then  sutured down at the internal oblique muscle behind the cord thus completely  obliterating the internal ring.  All areas of dissection were then checked  for hemostasis and additional bleeding points treated with electrocautery.  Sponge, instrument and sharps counts were verified.  The cord was returned  to its normal anatomic position and the external oblique aponeurosis closed  over the cord with a running 2-0 Vicryl suture thus approximating the  external ring.  Scarpa's fascia was then closed with a running 3-0 Vicryl   suture and the skin was closed with running 4-0 Monocryl suture.   Attention was then turned to the left side where a symmetrically placed  transverse incision was made in the lower abdominal crease with dissection  carried down the external oblique aponeurosis.  The external oblique  aponeurosis was opened up through the external inguinal ring with protection  of the ilioinguinal nerve which was retracted by a Gelpi retractor.  The  spermatic cord was elevated and held with a Penrose drain.  The hernia sac  was then dissected free from the spermatic cord carrying the dissection up  to the internal ring.  The sac was opened and there were no intra-abdominal  contents within the sac.  The neck of the sac was then ligated with a  pursestring of 2-0 silk.  The redundant sac was removed and forwarded for  pathologic evaluation.  The floor of the inguinal canal was then repaired  with a patch of polypropylene mesh which was sewn in at the pubic tubercle  and carried up along the conjoined tendon to the internal ring with a 2-0  Prolene suture and again from the pubic tubercle up along the shelving edge  of Poupart's ligament to the internal ring.  The mesh was then split as to  allow normal emanation of the cord.  The tails of the mesh were then trimmed  and sutured down into the internal oblique muscle behind the cord thus  obliterating the defect in the region of the internal ring.  All areas were  then checked for hemostasis and noted to be dry.  Sponge, instrument and  sharp counts were verified.  The external oblique aponeurosis was then  closed over the cord with a running 2-0 Vicryl suture.  Scarpa's fascia was  closed with a running 3-0 Vicryl suture and the skin was closed with a  running 4-0 Vicryl suture.  Both wounds were then reinforced with Steri-  Strips and sterile dressings were applied. The anesthetic was reversed and the patient removed from the operating room to the recovery  room in stable  condition.  He tolerated the procedure well.  Leonie Man, M.D.    PB/MEDQ  D:  08/16/2003  T:  08/17/2003  Job:  [id number]

## 2010-09-14 NOTE — Op Note (Signed)
NAMENASHUA, HOMEWOOD               ACCOUNT NO.:  [id number]   MEDICAL RECORD NO.:  [id number]          PATIENT TYPE:  AMB   LOCATION:  SDS                          FACILITY:  [facility]   PHYSICIAN:  Salley Scarlet., M.D.DATE OF BIRTH:  01/17/1923   DATE OF PROCEDURE:  04/08/2006  DATE OF DISCHARGE:  04/08/2006                               OPERATIVE REPORT   PREOPERATIVE DIAGNOSIS:  Immature cataract, right eye.   POSTOPERATIVE DIAGNOSIS:  Immature cataract, right eye.   OPERATION:  Kelman phacoemulsification cataract, right eye, with  intraocular lens implantation.   ANESTHESIA:  Local using Xylocaine 2% with Marcaine 0.75% and Wydase.   JUSTIFICATION FOR PROCEDURE:  This is an 77 year old gentleman  complaining of blurred vision.  He stated everything looks cloudy  causing him difficulty seeing to read.  He was evaluated and found to  have a visual acuity best corrected to 20/100 on the right and 20/70 on  the left.  There were bilateral immature cataracts, worse on the right  than the left.  Cataract extraction with intraocular lens implantation  was recommended.  He is admitted at this time for that purpose.   PROCEDURE:  Under the influence of IV sedation, a Van Lint akinesia and  retrobulbar anesthesia was given.  The patient was prepped and draped in  the usual manner.  The lid speculum was inserted under the upper and  lower lids of the right eye and a 4-0 silk traction suture was passed  through the belly of the superior rectus muscle for traction.  A fornix  based conjunctival flap was turned and hemostasis achieved using  cautery.  An incision was made in the sclera at the limbus.  This  incision was dissected down into clear cornea using a crescent blade.  A  sideport incision was made at the 1:30 o'clock position.  Ocucoat was  injected into the eye through the sideport incision.  The anterior  chamber was entered through the corneoscleral tunnel incision at the  11:30 o'clock position using a 2.75 mm keratome.  An anterior  capsulotomy was done using a bent 25 gauge needle.  The nucleus was  hydrodissected using Xylocaine.  The KTP handpiece was passed into the  eye and the nucleus was emulsified without difficulty.  The residual  cortical material was aspirated.  The posterior capsule was polished  using an olive tip polisher.  The wound was widened slightly to  accommodate a foldable silicone lens.  The lens was seated into the eye  behind the iris without difficulty.  The anterior chamber was reformed  and the pupil was constricted using Miochol.  The residual Ocucoat was  aspirated from the eye.  The lips of the wound were hydrated and tested  to make sure there was no leak.  After ascertaining there was no leak,  the conjunctiva was closed over the wound using thermal cautery.  1 mL  of Celestone and 0.5 mL of gentamicin were injected subconjunctivally.  Maxitrol ophthalmic ointment and Pilocarpine ointment were applied along  with a patch and Fox shield.  The patient tolerated  the procedure well  and  was discharged to the post anesthesia recovery room in satisfactory  condition.  He is instructed to rest today, to take Vicodin every 4  hours as needed for pain, and to see me in the office tomorrow for  further evaluation.   DISCHARGE DIAGNOSIS:  Immature cataract, right eye.      Salley Scarlet., M.D.  Electronically Signed     TB/MEDQ  D:  04/09/2006  T:  04/09/2006  Job:  [id number]

## 2010-09-14 NOTE — H&P (Signed)
NAMEDEKOTA, SHENK               ACCOUNT NO.:  [id number]   MEDICAL RECORD NO.:  [id number]          PATIENT TYPE:  EMS   LOCATION:  MAJO                         FACILITY:  [facility]   PHYSICIAN:  James L. Deterding, M.D.DATE OF BIRTH:  October 24, 1922   DATE OF ADMISSION:  09/02/2005  DATE OF DISCHARGE:                                HISTORY & PHYSICAL   ADMITTING DIAGNOSIS:  Acute renal failure.   HISTORY OF PRESENT ILLNESS:  This is an 77 year old gentleman with no  significant past medical history.  He does have a history of inguinal hernia  repair in 2005.  He presents with swelling of the lower extremities and  scrotum for several weeks.  It has been progressive.  He has had complaints  of severe weakness for 7-10 days, worsening today.  Family members state his  mental status has also been in and out.  He apparent lives alone.  He denies  nausea, vomiting, itching or muscle cramping, but initially denied it, but  then he notes he has increased shortness of breath.  He has PND and sleeps  on 2 pillows and he says he can void normally, but he cannot tell me when he  passes water less; sometimes he has had to push on his abdomen to void.   He has no history of diabetes, hypertension or nonsteroidals, but he does  use occasional Anacin.  No medications he claims at home.  He says he has an  allergy to milk, wheat and eggs.   As a child, apparently he had a history of urinary tract narrowing, he says,  and a very small tube coming out his penis.  It was not clear what was done  at that time.  He denies hematuria or stones and he denies recent voiding  symptoms.   PAST MEDICAL HISTORY:  As listed above.   MEDICATIONS:  He denies any medications except occasional Anacin.   ALLERGIES:  MILK, WHEAT and EGGS.   SOCIAL HISTORY:  He lives by himself.  He is divorced and has 1 child.   REVIEW OF SYSTEMS:  He denies alcohol and smoking.   REVIEW OF SYSTEMS:  HEENT:  He denies problems.   CARDIOVASCULAR:  He says he  does get short of breath with minimal exertion right now; he says it started  around Christmastime after he had had a long walk.  GI:  He denies problems.  He say he has been eating well.  He has some watery stools.  MUSCULOSKELETAL:  He denies problems.  PULMONARY:  He denies problems.  CARDIOVASCULAR:  As listed above.  SKIN:  Negative.  NEUROLOGICAL:  As  listed above  with the family, but he denies problems.   PHYSICAL EXAMINATION:  GENERAL:  An elderly gentleman who has rambling  mental status, almost confabulating.  VITAL SIGNS:  Blood pressure 167/102, temperature 98, heart rate 50-89 and  irregular.  HEENT:  Fundi are benign.  Pharynx is somewhat dry.  NECK:  No abnormalities.  CARDIOVASCULAR:  Regular rhythm with a grade 2/6 holosystolic murmur best  heard at the apex, 3  to 4+ edema, 2+ dorsalis pedal pulses, no bruits noted.  PMI at the 12 cm lateral to the midsternal line at the 5th intercostal  space.  LUNGS:  Crackles in both bases with decreased expansion and normal breath  sounds.  Normal rise to percussion.  ABDOMEN:  Distended, clearly has a suprapubic mass that percusses as well as  palpates and he has to avoid any push on it, but he does not pass any water.  MUSCULOSKELETAL:  Unremarkable.  NEUROLOGICAL:  Cranial nerves II-XII are intact.  Motor is 4/5 and  symmetric.  Deep tendon reflexes are 2+/4+.  Toes are downgoing.  Mental  status:  He is rambling as mentioned above, oriented x2.   LABORATORY DATA:  Hemoglobin is 7.8, white count 6300, platelets 251,000.  Albumin 3.3.  Sodium 136, potassium 8.7, chloride 115, bicarb of 16,  creatinine of 16.8, BUN of 131, glucose of 99.   ASSESSMENT:  1.  Known acute renal failure with volume excess, hyperkalemia, uremia and      anemia secondary to lower urinary tract obstruction and most likely need      to treat his potassium and relieve obstruction.  Currently, he may need      dialysis.   2.  Volume excess.  He needs diuresis.  He may diurese with the assistance      of Foley.  3.  Anemia secondary to #1.  4.  Confusion secondary to #1.   PLAN:  Calcium, bicarb, glucose and insulin.  We will place a Foley catheter  and he may need a femoral catheter for dialysis.           ______________________________  Llana Aliment Deterding, M.D.     JLD/MEDQ  D:  09/03/2005  T:  09/03/2005  Job:  [id number]

## 2010-09-14 NOTE — Op Note (Signed)
Shawn Booker, Shawn Booker               ACCOUNT NO.:  [id number]   MEDICAL RECORD NO.:  [id number]          PATIENT TYPE:  INP   LOCATION:  5523                         FACILITY:  [facility]   PHYSICIAN:  Excell Seltzer. Annabell Howells, M.D.    DATE OF BIRTH:  Sep 04, 1922   DATE OF PROCEDURE:  09/11/2005  DATE OF DISCHARGE:                                 OPERATIVE REPORT   PRIMARY CARE PHYSICIAN:  Fayrene Fearing L. Deterding, M.D.   PROCEDURE:  1.  Cystoscopy.  2.  Dorsal slit.  3.  Prostate ultrasound with biopsy.   PREOPERATIVE DIAGNOSIS:  Hematuria and elevated PSA.   POSTOPERATIVE DIAGNOSIS:  Hematuria and elevated PSA with persistent  phimosis.   SURGEON:  Excell Seltzer. Annabell Howells, M.D.   ANESTHESIA:  General.   SPECIMEN:  Right and left prostate biopsies.   DRAIN:  An 18-French Foley catheter.   COMPLICATIONS:  None.   INDICATIONS:  Mr. Shawn Booker is an 77 year old black male who originally  presented in urinary retention with acute renal failure.  His bladder had  4.5 liters.  A PSA done, at that time was 62.  He has had persistent  hematuria since catheter placement; and it was felt that cystoscopy and  prostate biopsy were indicated.  He was initially cystoscoped in the ER in  order to facilitate catheter placement, because of his massively dilated  bladder, I was unable to do a thorough cystoscopic exam.   FINDINGS AND PROCEDURE:  The patient had been on Cipro and received Rocephin  preoperatively.  He was taken to the operating room where general anesthetic  was induced.  He was placed in the lithotomy position.  His perineum and  genitalia were prepped with solution and he was draped in the usual sterile  fashion.   Cystoscopy was performed using the 22-French scope and 12-and-70-degree  lenses.  Examination revealed a normal urethra.  The external sphincter was  intact.  The prostatic urethra was approximately 3 cm in length with  trilobar hyperplasia with some degree of obstruction.  Examination of  bladder revealed multiple hemorrhagic areas and bullous edema consistent  with his bladder stretch injury.  Nothing suggestive of neoplasm was  identified.  No stones were seen.  The ureteral orifices were unremarkable.   After completion of cystoscopy, I felt that a dorsal slit should be added to  the procedure to aid exposure of the meatus for ease of catheter change.  This was done using a straight hemostat to crush the dorsal tissue.  This  was then incised.  The cut edges were then closed with running 3-0 chromic  suture; once an adequate dorsal slit had been performed.   At this point, the transrectal ultrasound probe was placed and set on 7.5  MHz.  Scanning was performed.  Examination revealed somewhat vague seminal  vesicles.  That were not well defined.  The prostate also had a rather  variegated echotexture with an outer, well-defined peripheral zone.  There  were some cystic changes and calcifications in the mid to the base of the  prostate.  The sagittal views revealed no  additional abnormalities.  No  obvious hypoechoic lesions were noted, but the entire prostate had a rather  diffuse variegated echo pattern.  The seminal vesicle prostate angle was  unremarkable.  The prostate volume is 69 mL.   After completion of the diagnostic scan, a prostate biopsy was performed, 3  cores were obtained from the left, 3 cores were obtained from the right;  1  at the base, mid, and apex on each side.  At this point, a fresh 18-French  Foley catheter was inserted.  The balloon was filled with 10 mL of sterile  fluid and the catheter was placed to straight drainage.  The patient was  taken down from the lithotomy position.  His anesthetic was reversed.  He  was moved to the recovery room in stable condition.  There were no  complications.      Excell Seltzer. Annabell Howells, M.D.  Electronically Signed     JJW/MEDQ  D:  09/11/2005  T:  09/12/2005  Job:  [id number]

## 2010-09-14 NOTE — Op Note (Signed)
NAMEGLEB, MCGUIRE               ACCOUNT NO.:  [id number]   MEDICAL RECORD NO.:  [id number]          PATIENT TYPE:  AMB   LOCATION:  SDS                          FACILITY:  [facility]   PHYSICIAN:  Salley Scarlet., M.D.DATE OF BIRTH:  January 03, 1923   DATE OF PROCEDURE:  DATE OF DISCHARGE:  05/27/2008                               OPERATIVE REPORT   PREOPERATIVE DIAGNOSIS:  Immature cataract, left eye.   POSTOPERATIVE DIAGNOSIS:  Immature cataract, left eye.   OPERATION:  Kelman phacoemulsification cataract, left eye with  intraocular lens implantation.   ANESTHESIA:  Local using Xylocaine, 2% with Marcaine, 0.75% Wydase.   JUSTIFICATION OF PROCEDURE:  This is an 77 year old gentleman who  underwent a cataract extraction from the right eye approximately 2 years  ago with good visual result.  He has developed a cataract of the left  eye, which causes him difficulty to see and read.  He was evaluated and  found to have visual acuity best corrected to 20/50 on the right, 20/80  on the left.  There was intraocular lens present on the right with 2+  nuclear sclerotic cataract on the left eye.  Cataract extraction of the  left eye was recommended.  He is admitted this time for that purpose.   PROCEDURE:  Under influence of IV sedation, a Van Lint akinesia and  retrobulbar anesthesia was given.  The patient was prepped and draped in  the usual manner.  The lid speculum was inserted under the upper and  lower lid of the left eye and a 4-0 silk traction suture was passed  through the belly of the superior rectus muscle retraction.  A fornix-  based conjunctival flap was turned and hemostasis achieved by using  cautery.  An incision was made in the sclera at the limbus.  This  incision was dissected down into the cornea using a crescent blade.  A  sideport incision was made at the 1:30 o'clock position.  OcuCoat was  then injected into the eye through the sideport incision.  The anterior  chamber was then entered through the corneoscleral tunnel incision at  the 11:30 o'clock position.  An anterior capsulotomy was done using a  bent 25-gauge needle.  The nucleus was hydrodissected using Xylocaine.  The KPE handpiece was passed into the eye and the nucleus was emulsified  without difficulty.  The residual cortical material was aspirated.  The  posterior capsule was polished using olive-tip polisher.  The wound was  widened slightly to accommodate a foldable silicone lens.  The lens was  seated into the eye behind the iris without difficulty.  The anterior  chamber was reformed and pupils constricted using Miochol.  The lips of  the wound were hydrated and tested to make sure that there was no leak.  After ascertaining there is no leak, the conjunctiva was closed over  wound using thermal cautery.  A 1 mL of Celestone, 0.5 mL of gentamicin  were then injected subconjunctivally.  Maxitrol ophthalmic ointment and  prilocaine ointment were applied along with a patch and Fox shield.  The  patient tolerated  the procedure well and was discharged to the post  anesthesia recovery in a satisfactory condition.  He was  instructed to rest today, to take Tylenol every 4 hours as needed for  pain, and see me in office tomorrow for further evaluation.   DISCHARGE DIAGNOSIS:  Immature cataract, left eye.      Salley Scarlet., M.D.  Electronically Signed     TB/MEDQ  D:  05/30/2008  T:  05/31/2008  Job:  [id number]

## 2011-02-04 LAB — HEMOGLOBIN AND HEMATOCRIT, BLOOD
HCT: 33.9 — ABNORMAL LOW
Hemoglobin: 11.6 — ABNORMAL LOW

## 2011-02-04 LAB — BASIC METABOLIC PANEL
Chloride: 106
Creatinine, Ser: 1.95 — ABNORMAL HIGH
GFR calc Af Amer: 40 — ABNORMAL LOW
Potassium: 4.6
Sodium: 145

## 2011-02-08 LAB — URINALYSIS, ROUTINE W REFLEX MICROSCOPIC
Glucose, UA: NEGATIVE
Ketones, ur: NEGATIVE
Nitrite: NEGATIVE
Specific Gravity, Urine: 1.013
pH: 5

## 2011-02-08 LAB — CK TOTAL AND CKMB (NOT AT ARMC)
CK, MB: 3.7
Relative Index: INVALID
Relative Index: INVALID
Total CK: 97
Total CK: 97

## 2011-02-08 LAB — I-STAT 8, (EC8 V) (CONVERTED LAB)
Chloride: 104
Glucose, Bld: 98
Hemoglobin: 13.6
Potassium: 3.7
Sodium: 140
TCO2: 31
pH, Ven: 7.359 — ABNORMAL HIGH

## 2011-02-08 LAB — CBC
HCT: 37 — ABNORMAL LOW
Hemoglobin: 12.5 — ABNORMAL LOW
MCV: 95.6
WBC: 4.1

## 2011-02-08 LAB — B-NATRIURETIC PEPTIDE (CONVERTED LAB): Pro B Natriuretic peptide (BNP): 380 — ABNORMAL HIGH

## 2011-02-08 LAB — LIPID PANEL
Cholesterol: 158
Triglycerides: 101

## 2011-02-08 LAB — CREATININE, SERUM
GFR calc Af Amer: 34 — ABNORMAL LOW
GFR calc non Af Amer: 28 — ABNORMAL LOW

## 2011-02-08 LAB — URINE MICROSCOPIC-ADD ON

## 2011-02-08 LAB — COMPREHENSIVE METABOLIC PANEL
Alkaline Phosphatase: 93
BUN: 35 — ABNORMAL HIGH
Chloride: 101
Creatinine, Ser: 2.35 — ABNORMAL HIGH
GFR calc non Af Amer: 27 — ABNORMAL LOW
Glucose, Bld: 269 — ABNORMAL HIGH
Potassium: 4
Total Bilirubin: 1.1

## 2011-02-08 LAB — PROTIME-INR
INR: 1.1
Prothrombin Time: 14.7

## 2011-02-08 LAB — URINE CULTURE

## 2011-02-08 LAB — D-DIMER, QUANTITATIVE: D-Dimer, Quant: 0.49 — ABNORMAL HIGH

## 2011-02-08 LAB — HEMOGLOBIN A1C: Hgb A1c MFr Bld: 5.7

## 2011-02-08 LAB — TROPONIN I
Troponin I: 0.06
Troponin I: 0.07 — ABNORMAL HIGH

## 2011-02-08 LAB — T4, FREE: Free T4: 1.14

## 2011-02-08 LAB — CARDIAC PANEL(CRET KIN+CKTOT+MB+TROPI)
Relative Index: 2.2
Troponin I: 0.02

## 2013-03-20 ENCOUNTER — Encounter: Payer: Self-pay | Admitting: Interventional Cardiology

## 2013-03-20 ENCOUNTER — Encounter: Payer: Self-pay | Admitting: *Deleted

## 2013-03-20 DIAGNOSIS — D649 Anemia, unspecified: Secondary | ICD-10-CM | POA: Insufficient documentation

## 2013-03-20 DIAGNOSIS — I471 Supraventricular tachycardia: Secondary | ICD-10-CM | POA: Insufficient documentation

## 2013-03-20 DIAGNOSIS — I341 Nonrheumatic mitral (valve) prolapse: Secondary | ICD-10-CM | POA: Insufficient documentation

## 2013-03-20 DIAGNOSIS — I10 Essential (primary) hypertension: Secondary | ICD-10-CM | POA: Insufficient documentation

## 2013-03-20 DIAGNOSIS — N189 Chronic kidney disease, unspecified: Secondary | ICD-10-CM | POA: Insufficient documentation

## 2013-03-20 DIAGNOSIS — C679 Malignant neoplasm of bladder, unspecified: Secondary | ICD-10-CM | POA: Insufficient documentation

## 2013-03-20 DIAGNOSIS — C61 Malignant neoplasm of prostate: Secondary | ICD-10-CM | POA: Insufficient documentation

## 2013-03-22 ENCOUNTER — Ambulatory Visit: Payer: Self-pay | Admitting: Interventional Cardiology

## 2013-03-26 ENCOUNTER — Encounter: Payer: Self-pay | Admitting: Interventional Cardiology

## 2013-04-20 ENCOUNTER — Ambulatory Visit (INDEPENDENT_AMBULATORY_CARE_PROVIDER_SITE_OTHER): Payer: Medicare Other | Admitting: Interventional Cardiology

## 2013-04-20 ENCOUNTER — Encounter: Payer: Self-pay | Admitting: Interventional Cardiology

## 2013-04-20 VITALS — BP 120/72 | HR 58 | Ht 70.0 in | Wt 170.0 lb

## 2013-04-20 DIAGNOSIS — I10 Essential (primary) hypertension: Secondary | ICD-10-CM

## 2013-04-20 DIAGNOSIS — I471 Supraventricular tachycardia: Secondary | ICD-10-CM

## 2013-04-20 DIAGNOSIS — I340 Nonrheumatic mitral (valve) insufficiency: Secondary | ICD-10-CM

## 2013-04-20 DIAGNOSIS — I059 Rheumatic mitral valve disease, unspecified: Secondary | ICD-10-CM

## 2013-04-20 NOTE — Progress Notes (Signed)
Patient ID: Shawn Booker, male   DOB: 1922/06/11, 79 y.o.   MRN: [id number] Past Medical History  AV node reentrant tachycardia, 2008   Posterior leaflet mitral valve prolapse and accompanying systolic murmur   Hypertension   Prostate Cancer, locally invasive   Bladder cancer followed by Dr. Annabell Howells   stage III chronic kidney disease   Anemia      [address] [place], [place]  [address] Phone: [phone] Fax:  [phone]  Date:  04/20/2013   ID:  Shawn Booker, DOB [birth year], MRN [id number]  PCP:  No primary provider on file.   ASSESSMENT:  1. Mitral regurgitation, stable, and clinically mild to moderate 2. History of AV nodal reentrant tachycardia without recent recurrence 3. Hypertension, under good control  PLAN:  1. Clinical followup in 8-12 month 2. No change in the current medical regimen 3. Clinical followup in 9-12 months, earlier if cardiac symptoms such as tachycardia, dyspnea, swelling.   SUBJECTIVE: Shawn Booker is a 79 y.o. male who is doing well. There no cardiopulmonary complaints. No episodes of syncope or prolonged palpitations have occurred.   Wt Readings from Last 3 Encounters:  04/20/13 170 lb (77.111 kg)     Past Medical History  Diagnosis Date  . MVP (mitral valve prolapse)   . Paroxysmal supraventricular tachycardia   . CKD (chronic kidney disease)   . HTN (hypertension)   . Prostate cancer   . Bladder cancer   . Anemia   . H/O osteopenia     Current Outpatient Prescriptions  Medication Sig Dispense Refill  . Coenzyme Q10 200 MG capsule Take 200 mg by mouth daily.      . Ergocalciferol (VITAMIN D2) 400 UNITS TABS Take 1 tablet by mouth daily.      . furosemide (LASIX) 80 MG tablet Take 80 mg by mouth.      . Multiple Vitamins-Minerals (MULTIVITAMIN PO) Take by mouth daily.       No current facility-administered medications for this visit.    Allergies:   No Known Allergies  Social History:  The  patient  reports that he has never smoked. He does not have any smokeless tobacco history on file. He reports that he does not drink alcohol or use illicit drugs.   ROS:  Please see the history of present illness.      All other systems reviewed and negative.   OBJECTIVE: VS:  BP 120/72  Pulse 58  Ht 5\' 10"  (1.778 m)  Wt 170 lb (77.111 kg)  BMI 24.39 kg/m2 Well nourished, well developed, in no acute distress,  Appearing younger than stated age HEENT: normal Neck: JVD flat. Carotid bruit absent  Cardiac:  normal S1, S2; RRR; mid systolic murmur with late systolic crescendo Lungs:  clear to auscultation bilaterally, no wheezing, rhonchi or rales Abd: soft, nontender, no hepatomegaly Ext: Edema absent. Pulses 2+ and symmetric Skin: warm and dry Neuro:  CNs 2-12 intact, no focal abnormalities noted  EKG:  Normal sinus rhythm/bradycardia with slight left axis deviation.       Signed, Darci Needle III, MD 04/20/2013 11:44 AM

## 2013-04-20 NOTE — Patient Instructions (Signed)
Your physician recommends that you continue on your current medications as directed. Please refer to the Current Medication list given to you today.  Your physician wants you to follow-up in: 9-12 months You will receive a reminder letter in the mail two months in advance. If you don't receive a letter, please call our office to schedule the follow-up appointment.  

## 2013-10-18 ENCOUNTER — Other Ambulatory Visit: Payer: Self-pay | Admitting: Urology

## 2013-10-18 DIAGNOSIS — C61 Malignant neoplasm of prostate: Secondary | ICD-10-CM

## 2013-10-26 ENCOUNTER — Other Ambulatory Visit: Payer: Self-pay | Admitting: Urology

## 2013-10-26 DIAGNOSIS — C61 Malignant neoplasm of prostate: Secondary | ICD-10-CM

## 2013-11-05 ENCOUNTER — Encounter (HOSPITAL_COMMUNITY): Payer: Medicare Other

## 2013-11-08 ENCOUNTER — Encounter (HOSPITAL_COMMUNITY): Payer: Medicare Other

## 2013-11-08 ENCOUNTER — Ambulatory Visit (HOSPITAL_COMMUNITY): Payer: Medicare Other

## 2013-11-15 ENCOUNTER — Encounter (HOSPITAL_COMMUNITY): Payer: Self-pay

## 2013-11-15 ENCOUNTER — Ambulatory Visit (HOSPITAL_COMMUNITY)
Admission: RE | Admit: 2013-11-15 | Discharge: 2013-11-15 | Disposition: A | Discharge status: Home / Self Care | Payer: Medicare Other | Source: Ambulatory Visit | Attending: Urology | Admitting: Urology

## 2013-11-15 ENCOUNTER — Encounter (HOSPITAL_COMMUNITY)
Admission: RE | Admit: 2013-11-15 | Discharge: 2013-11-15 | Disposition: A | Discharge status: Home / Self Care | Payer: Medicare Other | Source: Ambulatory Visit | Attending: Urology | Admitting: Urology

## 2013-11-15 DIAGNOSIS — C61 Malignant neoplasm of prostate: Secondary | ICD-10-CM

## 2013-11-15 DIAGNOSIS — N3289 Other specified disorders of bladder: Secondary | ICD-10-CM | POA: Insufficient documentation

## 2013-11-15 DIAGNOSIS — M259 Joint disorder, unspecified: Secondary | ICD-10-CM | POA: Insufficient documentation

## 2013-11-15 MED ORDER — TECHNETIUM TC 99M MEDRONATE IV KIT
25.0000 | PACK | Freq: Once | INTRAVENOUS | Status: AC | PRN
  Administered 2013-11-15: 26.5 via INTRAVENOUS

## 2014-02-04 ENCOUNTER — Ambulatory Visit (INDEPENDENT_AMBULATORY_CARE_PROVIDER_SITE_OTHER): Payer: Medicare Other | Admitting: Interventional Cardiology

## 2014-02-04 ENCOUNTER — Encounter: Payer: Self-pay | Admitting: Interventional Cardiology

## 2014-02-04 VITALS — BP 130/70 | HR 59 | Ht 70.0 in | Wt 170.0 lb

## 2014-02-04 DIAGNOSIS — N183 Chronic kidney disease, stage 3 unspecified: Secondary | ICD-10-CM

## 2014-02-04 DIAGNOSIS — I341 Nonrheumatic mitral (valve) prolapse: Secondary | ICD-10-CM

## 2014-02-04 DIAGNOSIS — I10 Essential (primary) hypertension: Secondary | ICD-10-CM

## 2014-02-04 DIAGNOSIS — I471 Supraventricular tachycardia: Secondary | ICD-10-CM

## 2014-02-04 NOTE — Patient Instructions (Signed)
Your physician recommends that you continue on your current medications as directed. Please refer to the Current Medication list given to you today.  Your physician wants you to follow-up in: 1 year ov You will receive a reminder letter in the mail two months in advance. If you don't receive a letter, please call our office to schedule the follow-up appointment.   Call if you have any problems before your follow up appointment

## 2014-02-04 NOTE — Progress Notes (Signed)
Patient ID: Shawn Booker, male   DOB: 1922-11-27, 80 y.o.   MRN: [id number]    [address] [place], [place]  [address] Phone: [phone] Fax:  [phone]  Date:  02/04/2014   ID:  Shawn Booker, DOB 02-Dec-1922, MRN [id number]  PCP:  Elyn Peers, MD   ASSESSMENT:  1. Mitral regurgitation, mitral valve prolapse, mild to moderate clinically, asymptomatic 2. Hypertension, clinically stable 3. History of PSVT, no recurrences  PLAN:  1. He is to call if shortness of breath, prolonged palpitations  2. Physical activity as tolerated 3. No change in current medical regimen   SUBJECTIVE: Shawn Booker is a 80 y.o. male  who is doing well. He denies syncope. No orthopnea, PND, or edema. He's had no prolonged episodes of palpitation. No transient neurological complaints.   Wt Readings from Last 3 Encounters:  02/04/14 170 lb (77.111 kg)  04/20/13 170 lb (77.111 kg)     Past Medical History  Diagnosis Date  . MVP (mitral valve prolapse)   . Paroxysmal supraventricular tachycardia   . CKD (chronic kidney disease)   . HTN (hypertension)   . Prostate cancer   . Bladder cancer   . Anemia   . H/O osteopenia     Current Outpatient Prescriptions  Medication Sig Dispense Refill  . Coenzyme Q10 200 MG capsule Take 200 mg by mouth daily.      . Ergocalciferol (VITAMIN D2) 400 UNITS TABS Take 1 tablet by mouth daily.      . furosemide (LASIX) 80 MG tablet Take 80 mg by mouth.      . Multiple Vitamins-Minerals (MULTIVITAMIN PO) Take by mouth daily.       No current facility-administered medications for this visit.    Allergies:   No Known Allergies  Social History:  The patient  reports that he has never smoked. He does not have any smokeless tobacco history on file. He reports that he does not drink alcohol or use illicit drugs.   ROS:  Please see the history of present illness.   No blood in urine or stool. No transient neurological symptoms. Appetite is  been stable.   All other systems reviewed and negative.   OBJECTIVE: VS:  BP 130/70  Pulse 59  Ht 5\' 10"  (1.778 m)  Wt 170 lb (77.111 kg)  BMI 24.39 kg/m2 Well nourished, well developed, in no acute distress,  slender HEENT: normal Neck: JVD  flat. Carotid bruit  absent  Cardiac:  normal S1, S2; RRR; late systolic crescendo of mitral regurgitation. Lungs:  clear to auscultation bilaterally, no wheezing, rhonchi or rales Abd: soft, nontender, no hepatomegaly Ext: Edema  absent. Pulses  2+ Skin: warm and dry Neuro:  CNs 2-12 intact, no focal abnormalities noted  EKG:  Not performed       Signed, Illene Labrador III, MD 02/04/2014 9:49 AM

## 2014-11-19 ENCOUNTER — Encounter (HOSPITAL_COMMUNITY): Payer: Self-pay | Admitting: Emergency Medicine

## 2014-11-19 ENCOUNTER — Emergency Department (HOSPITAL_COMMUNITY)
Admission: EM | Admit: 2014-11-19 | Discharge: 2014-11-19 | Disposition: A | Discharge status: Home / Self Care | Payer: Medicare Other | Attending: Emergency Medicine | Admitting: Emergency Medicine

## 2014-11-19 DIAGNOSIS — Z79899 Other long term (current) drug therapy: Secondary | ICD-10-CM | POA: Diagnosis not present

## 2014-11-19 DIAGNOSIS — Z8739 Personal history of other diseases of the musculoskeletal system and connective tissue: Secondary | ICD-10-CM | POA: Insufficient documentation

## 2014-11-19 DIAGNOSIS — Z862 Personal history of diseases of the blood and blood-forming organs and certain disorders involving the immune mechanism: Secondary | ICD-10-CM | POA: Insufficient documentation

## 2014-11-19 DIAGNOSIS — Z8551 Personal history of malignant neoplasm of bladder: Secondary | ICD-10-CM | POA: Insufficient documentation

## 2014-11-19 DIAGNOSIS — Z8546 Personal history of malignant neoplasm of prostate: Secondary | ICD-10-CM | POA: Insufficient documentation

## 2014-11-19 DIAGNOSIS — R011 Cardiac murmur, unspecified: Secondary | ICD-10-CM | POA: Insufficient documentation

## 2014-11-19 DIAGNOSIS — I129 Hypertensive chronic kidney disease with stage 1 through stage 4 chronic kidney disease, or unspecified chronic kidney disease: Secondary | ICD-10-CM | POA: Diagnosis not present

## 2014-11-19 DIAGNOSIS — N189 Chronic kidney disease, unspecified: Secondary | ICD-10-CM | POA: Insufficient documentation

## 2014-11-19 DIAGNOSIS — I10 Essential (primary) hypertension: Secondary | ICD-10-CM

## 2014-11-19 NOTE — ED Notes (Signed)
Patient here with reported high blood pressure readings at home. Denies all other symptoms stating "I feel good otherwise". Reports BP @ 170/88 at home. States normally it runs around 130 SBP.

## 2014-11-19 NOTE — Discharge Instructions (Signed)
Please follow up with your doctor for a recheck of your blood pressure.  Return if you develop headache, chest pain, difficulty breathing or if you have other concerns.  Hypertension Hypertension, commonly called high blood pressure, is when the force of blood pumping through your arteries is too strong. Your arteries are the blood vessels that carry blood from your heart throughout your body. A blood pressure reading consists of a higher number over a lower number, such as 110/72. The higher number (systolic) is the pressure inside your arteries when your heart pumps. The lower number (diastolic) is the pressure inside your arteries when your heart relaxes. Ideally you want your blood pressure below 120/80. Hypertension forces your heart to work harder to pump blood. Your arteries may become narrow or stiff. Having hypertension puts you at risk for heart disease, stroke, and other problems.  RISK FACTORS Some risk factors for high blood pressure are controllable. Others are not.  Risk factors you cannot control include:   Race. You may be at higher risk if you are African American.  Age. Risk increases with age.  Gender. Men are at higher risk than women before age 39 years. After age 75, women are at higher risk than men. Risk factors you can control include:  Not getting enough exercise or physical activity.  Being overweight.  Getting too much fat, sugar, calories, or salt in your diet.  Drinking too much alcohol. SIGNS AND SYMPTOMS Hypertension does not usually cause signs or symptoms. Extremely high blood pressure (hypertensive crisis) may cause headache, anxiety, shortness of breath, and nosebleed. DIAGNOSIS  To check if you have hypertension, your health care provider will measure your blood pressure while you are seated, with your arm held at the level of your heart. It should be measured at least twice using the same arm. Certain conditions can cause a difference in blood pressure  between your right and left arms. A blood pressure reading that is higher than normal on one occasion does not mean that you need treatment. If one blood pressure reading is high, ask your health care provider about having it checked again. TREATMENT  Treating high blood pressure includes making lifestyle changes and possibly taking medicine. Living a healthy lifestyle can help lower high blood pressure. You may need to change some of your habits. Lifestyle changes may include:  Following the DASH diet. This diet is high in fruits, vegetables, and whole grains. It is low in salt, red meat, and added sugars.  Getting at least 2 hours of brisk physical activity every week.  Losing weight if necessary.  Not smoking.  Limiting alcoholic beverages.  Learning ways to reduce stress. If lifestyle changes are not enough to get your blood pressure under control, your health care provider may prescribe medicine. You may need to take more than one. Work closely with your health care provider to understand the risks and benefits. HOME CARE INSTRUCTIONS  Have your blood pressure rechecked as directed by your health care provider.   Take medicines only as directed by your health care provider. Follow the directions carefully. Blood pressure medicines must be taken as prescribed. The medicine does not work as well when you skip doses. Skipping doses also puts you at risk for problems.   Do not smoke.   Monitor your blood pressure at home as directed by your health care provider. SEEK MEDICAL CARE IF:   You think you are having a reaction to medicines taken.  You have recurrent headaches  or feel dizzy.  You have swelling in your ankles.  You have trouble with your vision. SEEK IMMEDIATE MEDICAL CARE IF:  You develop a severe headache or confusion.  You have unusual weakness, numbness, or feel faint.  You have severe chest or abdominal pain.  You vomit repeatedly.  You have trouble  breathing. MAKE SURE YOU:   Understand these instructions.  Will watch your condition.  Will get help right away if you are not doing well or get worse. Document Released: 04/15/2005 Document Revised: 08/30/2013 Document Reviewed: 02/05/2013 [facility] Patient Information 2015 [place], [place]. This information is not intended to replace advice given to you by your health care provider. Make sure you discuss any questions you have with your health care provider.

## 2014-11-19 NOTE — ED Provider Notes (Signed)
CSN: [id number]     Arrival date & time 11/19/14  5400 History   First MD Initiated Contact with Patient 11/19/14 2046     Chief Complaint  Patient presents with  . Hypertension     (Consider location/radiation/quality/duration/timing/severity/associated sxs/prior Treatment) HPI   81 year old male with history of hypertension, paroxysmal SVT, mitral valve prolapse, prostate cancer and anemia presenting for evaluation of high blood pressure. Patient states he takes blood pressure daily at home. He normally runs around 867 systolic. Today his blood pressure was 619/509 systolic. This is high value according to him and he was concern. He decided to come to the ER for evaluation. Otherwise patient denies having fever, headache, vision changes, chest pain, difficulty breathing, abdominal pain, back pain, numbness or weakness. He takes Lasix but no other blood pressure medication. He does have a PCP. He denies any other environmental changes or medication changes. Patient lives at home by himself.    Past Medical History  Diagnosis Date  . MVP (mitral valve prolapse)   . Paroxysmal supraventricular tachycardia   . CKD (chronic kidney disease)   . HTN (hypertension)   . Prostate cancer   . Bladder cancer   . Anemia   . H/O osteopenia    Past Surgical History  Procedure Laterality Date  . Hernia repair    . Bladder tumor, cysto with fulguration    . Transurethral resection of prostate     History reviewed. No pertinent family history. History  Substance Use Topics  . Smoking status: Never Smoker   . Smokeless tobacco: Not on file  . Alcohol Use: No    Review of Systems  All other systems reviewed and are negative.     Allergies  Review of patient's allergies indicates no known allergies.  Home Medications   Prior to Admission medications   Medication Sig Start Date End Date Taking? Authorizing Provider  Coenzyme Q10 200 MG capsule Take 200 mg by mouth daily.    Historical  Provider, MD  Ergocalciferol (VITAMIN D2) 400 UNITS TABS Take 1 tablet by mouth daily.    Historical Provider, MD  furosemide (LASIX) 80 MG tablet Take 80 mg by mouth.    Historical Provider, MD  Multiple Vitamins-Minerals (MULTIVITAMIN PO) Take by mouth daily.    Historical Provider, MD   BP 159/68 mmHg  Pulse 77  Temp(Src) 97.8 F (36.6 C) (Oral)  Resp 14  Ht 5\' 11"  (1.803 m)  Wt 160 lb (72.576 kg)  BMI 22.33 kg/m2  SpO2 96% Physical Exam  Constitutional: He is oriented to person, place, and time. He appears well-developed and well-nourished. No distress.  Awake, alert, nontoxic appearance  HENT:  Head: Atraumatic.  Eyes: Conjunctivae are normal. Right eye exhibits no discharge. Left eye exhibits no discharge.  Neck: Normal range of motion. Neck supple.  Cardiovascular: Normal rate.   Murmur (4/6 holosystolic murmur best heard at the second intercostal space on the left chest radiates to neck.) heard. Pulmonary/Chest: Effort normal. No respiratory distress. He exhibits no tenderness.  Abdominal: Soft. There is no tenderness. There is no rebound.  Musculoskeletal: He exhibits no edema or tenderness.  ROM appears intact, no obvious focal weakness  Neurological: He is alert and oriented to person, place, and time.  Skin: Skin is warm and dry. No rash noted.  Psychiatric: He has a normal mood and affect.  Nursing note and vitals reviewed.   ED Course  Procedures (including critical care time)  9:30 PM Patient presents  with asymptomatic hypertension. He reports having a blood pressure of 588 systolic at home. In the ER his blood pressure is 502 systolic. He has no other complaint. He does have a PCP that he can follow up closely for further care. I do not think starting a new blood pressure medication is appropriate at this time. Reassurance given. Care discussed with Dr. Regenia Skeeter.   Labs Review Labs Reviewed - No data to display  Imaging Review No results found.   EKG  Interpretation None      MDM   Final diagnoses:  Essential hypertension    BP 133/82 mmHg  Pulse 63  Temp(Src) 97.8 F (36.6 C) (Oral)  Resp 14  Ht 5\' 11"  (1.803 m)  Wt 160 lb (72.576 kg)  BMI 22.33 kg/m2  SpO2 100%     Domenic Moras, PA-C 11/19/14 2203  Sherwood Gambler, MD 11/20/14 0020

## 2014-12-20 ENCOUNTER — Encounter (HOSPITAL_COMMUNITY): Payer: Self-pay

## 2014-12-20 DIAGNOSIS — Z8739 Personal history of other diseases of the musculoskeletal system and connective tissue: Secondary | ICD-10-CM | POA: Insufficient documentation

## 2014-12-20 DIAGNOSIS — Z79899 Other long term (current) drug therapy: Secondary | ICD-10-CM | POA: Insufficient documentation

## 2014-12-20 DIAGNOSIS — Z8546 Personal history of malignant neoplasm of prostate: Secondary | ICD-10-CM | POA: Insufficient documentation

## 2014-12-20 DIAGNOSIS — I129 Hypertensive chronic kidney disease with stage 1 through stage 4 chronic kidney disease, or unspecified chronic kidney disease: Secondary | ICD-10-CM | POA: Insufficient documentation

## 2014-12-20 DIAGNOSIS — N189 Chronic kidney disease, unspecified: Secondary | ICD-10-CM | POA: Insufficient documentation

## 2014-12-20 DIAGNOSIS — Z862 Personal history of diseases of the blood and blood-forming organs and certain disorders involving the immune mechanism: Secondary | ICD-10-CM | POA: Diagnosis not present

## 2014-12-20 DIAGNOSIS — Z8551 Personal history of malignant neoplasm of bladder: Secondary | ICD-10-CM | POA: Insufficient documentation

## 2014-12-20 DIAGNOSIS — R011 Cardiac murmur, unspecified: Secondary | ICD-10-CM | POA: Insufficient documentation

## 2014-12-20 NOTE — ED Notes (Signed)
Pt reports went to dentist this morning and could not get tooth extracted d/t elevated BP.  Last time pt took BP was several weeks ago and BP norma.   No other s/s noted.

## 2014-12-21 ENCOUNTER — Emergency Department (HOSPITAL_COMMUNITY)
Admission: EM | Admit: 2014-12-21 | Discharge: 2014-12-21 | Disposition: A | Discharge status: Home / Self Care | Payer: Medicare Other | Attending: Emergency Medicine | Admitting: Emergency Medicine

## 2014-12-21 DIAGNOSIS — I10 Essential (primary) hypertension: Secondary | ICD-10-CM

## 2014-12-21 LAB — I-STAT CHEM 8, ED
BUN: 15 mg/dL (ref 6–20)
CALCIUM ION: 1.13 mmol/L (ref 1.13–1.30)
CREATININE: 1.3 mg/dL — AB (ref 0.61–1.24)
Chloride: 103 mmol/L (ref 101–111)
GLUCOSE: 115 mg/dL — AB (ref 65–99)
HCT: 33 % — ABNORMAL LOW (ref 39.0–52.0)
HEMOGLOBIN: 11.2 g/dL — AB (ref 13.0–17.0)
Potassium: 4.1 mmol/L (ref 3.5–5.1)
Sodium: 139 mmol/L (ref 135–145)
TCO2: 23 mmol/L (ref 0–100)

## 2014-12-21 MED ORDER — LISINOPRIL 20 MG PO TABS: 10.0000 mg | ORAL_TABLET | Freq: Every day | ORAL | Status: DC

## 2014-12-21 NOTE — ED Provider Notes (Signed)
CSN: [id number]     Arrival date & time 12/20/14  2020 History  This chart was scribed for Merryl Hacker, MD by Meriel Pica, ED Scribe. This patient was seen in room A03C/A03C and the patient's care was started 1:17 AM.   Chief Complaint  Patient presents with  . Hypertension   The history is provided by the patient. No language interpreter was used.   HPI Comments: Shawn Booker is a 81 y.o. male who presents to the Emergency Department complaining of sudden onset, waxing and waning hypertension that began this morning during a dental office visit. The pt was seen by the dentist this morning who refused to perform several dental extractions due to the pt having HTN that was reading 761 systolic. Pt notes he then went home and checked his BP and found it to be in the 607 systolic reading prompting ED evaluation. BP is currently 169/94 mmHg. Pt is not currently prescribed BP medication and states he has never taken any medication for HTN in the past. Pt is followed by a PCP and reports he has never been diagnosed with HTN. Denies CP, SOB, headache, or any other pertinent symptoms.   Of note, chart review reveals he has a history of hypertension and has been seen in the ER for the same.  Past Medical History  Diagnosis Date  . MVP (mitral valve prolapse)   . Paroxysmal supraventricular tachycardia   . CKD (chronic kidney disease)   . HTN (hypertension)   . Prostate cancer   . Bladder cancer   . Anemia   . H/O osteopenia    Past Surgical History  Procedure Laterality Date  . Hernia repair    . Bladder tumor, cysto with fulguration    . Transurethral resection of prostate     History reviewed. No pertinent family history. Social History  Substance Use Topics  . Smoking status: Never Smoker   . Smokeless tobacco: None  . Alcohol Use: No    Review of Systems  Respiratory: Negative for shortness of breath.   Cardiovascular: Negative for chest pain.  Neurological: Negative  for headaches.  All other systems reviewed and are negative.  Allergies  Review of patient's allergies indicates no known allergies.  Home Medications   Prior to Admission medications   Medication Sig Start Date End Date Taking? Authorizing Provider  Coenzyme Q10 200 MG capsule Take 200 mg by mouth daily.   Yes Historical Provider, MD  Ergocalciferol (VITAMIN D2) 400 UNITS TABS Take 1 tablet by mouth daily.   Yes Historical Provider, MD  furosemide (LASIX) 80 MG tablet Take 80 mg by mouth.   Yes Historical Provider, MD  Multiple Vitamins-Minerals (MULTIVITAMIN PO) Take 1 tablet by mouth daily.    Yes Historical Provider, MD  lisinopril (PRINIVIL,ZESTRIL) 20 MG tablet Take 0.5 tablets (10 mg total) by mouth daily. 12/21/14   Merryl Hacker, MD   BP 169/94 mmHg  Pulse 61  Temp(Src) 98 F (36.7 C) (Oral)  Resp 16  Ht 5\' 11"  (1.803 m)  Wt 169 lb 6.4 oz (76.839 kg)  BMI 23.64 kg/m2  SpO2 99% Physical Exam  Constitutional: He is oriented to person, place, and time. He appears well-developed and well-nourished. No distress.  Elderly  HENT:  Head: Normocephalic and atraumatic.  Cardiovascular: Normal rate and regular rhythm.   Murmur heard. Pulmonary/Chest: Effort normal and breath sounds normal. No respiratory distress. He has no wheezes.  Abdominal: Soft. Bowel sounds are normal. There is  no tenderness. There is no rebound.  Musculoskeletal: He exhibits no edema.  Neurological: He is alert and oriented to person, place, and time.  Skin: Skin is warm and dry.  Psychiatric: He has a normal mood and affect.  Nursing note and vitals reviewed.   ED Course  Procedures  DIAGNOSTIC STUDIES: Oxygen Saturation is 99% on RA, normal by my interpretation.    COORDINATION OF CARE: 1:23 AM Discussed treatment plan with pt. Pt acknowledges and agrees to plan.   Labs Review Labs Reviewed  I-STAT CHEM 8, ED - Abnormal; Notable for the following:    Creatinine, Ser 1.30 (*)    Glucose,  Bld 115 (*)    Hemoglobin 11.2 (*)    HCT 33.0 (*)    All other components within normal limits    Imaging Review No results found. I have personally reviewed and evaluated these images and lab results as part of my medical decision-making.   EKG Interpretation   Date/Time:  Wednesday December 21 2014 01:35:27 EDT Ventricular Rate:  68 PR Interval:  199 QRS Duration: 98 QT Interval:  461 QTC Calculation: 490 R Axis:   3 Text Interpretation:  Sinus rhythm Borderline T wave abnormalities  Borderline prolonged QT interval Confirmed by Adylee Leonardo  MD, [place] ([id number])  on 12/21/2014 2:02:54 AM      MDM   Final diagnoses:  Essential hypertension   Patient presents with hypertension. No other symptoms at this time. Patient reports that he would like to start something for his blood pressure. He feels he will not be out again and his primary physician. Chem-8 and EKG obtained. Patient is on Lasix.  Kidney function in the past has been marginal. Will place on low-dose lisinopril and have patient follow-up with primary physician.  After history, exam, and medical workup I feel the patient has been appropriately medically screened and is safe for discharge home. Pertinent diagnoses were discussed with the patient. Patient was given return precautions.   I personally performed the services described in this documentation, which was scribed in my presence. The recorded information has been reviewed and is accurate.    Merryl Hacker, MD 12/21/14 [phone]

## 2014-12-21 NOTE — ED Notes (Signed)
Discharge instructions/prescriptions reviewed with patient. Understanding verbalized. Refused wheelchair at time of discharge. No distress noted. BP improved.

## 2014-12-21 NOTE — Discharge Instructions (Signed)

## 2015-05-15 ENCOUNTER — Other Ambulatory Visit: Payer: Self-pay | Admitting: Urology

## 2015-05-15 DIAGNOSIS — C61 Malignant neoplasm of prostate: Secondary | ICD-10-CM

## 2015-05-22 ENCOUNTER — Encounter (HOSPITAL_COMMUNITY)
Admission: RE | Admit: 2015-05-22 | Discharge: 2015-05-22 | Disposition: A | Discharge status: Home / Self Care | Payer: Medicare Other | Source: Ambulatory Visit | Attending: Urology | Admitting: Urology

## 2015-05-22 ENCOUNTER — Encounter (HOSPITAL_COMMUNITY): Payer: Medicare Other

## 2015-05-22 DIAGNOSIS — C61 Malignant neoplasm of prostate: Secondary | ICD-10-CM | POA: Diagnosis not present

## 2015-05-22 MED ORDER — FLUDEOXYGLUCOSE F - 18 (FDG) INJECTION
24.5000 | Freq: Once | INTRAVENOUS | Status: AC | PRN
  Administered 2015-05-22: 24.5 via INTRAVENOUS

## 2015-09-27 ENCOUNTER — Encounter: Payer: Self-pay | Admitting: Interventional Cardiology

## 2015-10-02 ENCOUNTER — Encounter: Payer: Self-pay | Admitting: Interventional Cardiology

## 2015-10-02 ENCOUNTER — Ambulatory Visit (INDEPENDENT_AMBULATORY_CARE_PROVIDER_SITE_OTHER): Payer: Medicare Other | Admitting: Interventional Cardiology

## 2015-10-02 VITALS — BP 128/84 | HR 59 | Ht 71.0 in | Wt 162.0 lb

## 2015-10-02 DIAGNOSIS — I471 Supraventricular tachycardia, unspecified: Secondary | ICD-10-CM

## 2015-10-02 DIAGNOSIS — I341 Nonrheumatic mitral (valve) prolapse: Secondary | ICD-10-CM | POA: Diagnosis not present

## 2015-10-02 DIAGNOSIS — I10 Essential (primary) hypertension: Secondary | ICD-10-CM | POA: Diagnosis not present

## 2015-10-02 DIAGNOSIS — N183 Chronic kidney disease, stage 3 unspecified: Secondary | ICD-10-CM

## 2015-10-02 NOTE — Progress Notes (Signed)
Cardiology Office Note    Date:  10/02/2015   ID:  Shawn Booker, DOB 07-20-1922, MRN [id number]  PCP:  Elyn Peers, MD  Cardiologist: Sinclair Grooms, MD   Chief Complaint  Patient presents with  . Follow-up    Htn/PSVT  . Cardiac Valve Problem    History of Present Illness:  Shawn Booker is a 82 y.o. male seen for follow-up of mitral valve prolapse/regurgitation, history of PSVT, and known chronic kidney disease.  Shawn Booker is doing well. He is now 82 years of age. He is slender, able ambulate without dyspnea and denies orthopnea/PND. No episodes of fainting. He denies peripheral edema. He has had no episodes of chest pain.    Past Medical History  Diagnosis Date  . MVP (mitral valve prolapse)   . Paroxysmal supraventricular tachycardia ([place])   . CKD (chronic kidney disease)   . HTN (hypertension)   . Prostate cancer ([place])   . Bladder cancer ([place])   . Anemia   . H/O osteopenia     Past Surgical History  Procedure Laterality Date  . Hernia repair    . Bladder tumor, cysto with fulguration    . Transurethral resection of prostate      Current Medications: Outpatient Prescriptions Prior to Visit  Medication Sig Dispense Refill  . Coenzyme Q10 200 MG capsule Take 200 mg by mouth daily.    . Ergocalciferol (VITAMIN D2) 400 UNITS TABS Take 1 tablet by mouth daily.    . furosemide (LASIX) 80 MG tablet Take 80 mg by mouth daily.     . Multiple Vitamins-Minerals (MULTIVITAMIN PO) Take 1 tablet by mouth daily.     Marland Kitchen lisinopril (PRINIVIL,ZESTRIL) 20 MG tablet Take 0.5 tablets (10 mg total) by mouth daily. (Patient not taking: Reported on 10/02/2015) 30 tablet 0   No facility-administered medications prior to visit.     Allergies:   Review of patient's allergies indicates no known allergies.   Social History   Social History  . Marital Status: Divorced    Spouse Name: N/A  . Number of Children: N/A  . Years of Education: N/A   Social History Main Topics    . Smoking status: Never Smoker   . Smokeless tobacco: Never Used  . Alcohol Use: No  . Drug Use: No  . Sexual Activity: Not Asked   Other Topics Concern  . None   Social History Narrative     Family History:  The patient's family history includes Heart disease in his mother.   ROS:   Please see the history of present illness.    Continues to walk on a treadmill intermittently. No specific complaints other than an occasional palpitation. These episodes have been very brief.  All other systems reviewed and are negative.   PHYSICAL EXAM:   VS:  BP 128/84 mmHg  Pulse 59  Ht 5\' 11"  (1.803 m)  Wt 162 lb (73.483 kg)  BMI 22.60 kg/m2   GEN: Well nourished, well developed, in no acute distress HEENT: normal Neck: no JVD, carotid bruits, or masses Cardiac: RRR and no rubs, or gallops,no edema . There is a 3/6 holosystolic with late crescendo murmur heard at the left lower sternal border, apex, and with radiation into the left axilla. Respiratory:  clear to auscultation bilaterally, normal work of breathing GI: soft, nontender, nondistended, + BS MS: no deformity or atrophy Skin: warm and dry, no rash Neuro:  Alert and Oriented x 3, Strength and sensation  are intact Psych: euthymic mood, full affect  Wt Readings from Last 3 Encounters:  10/02/15 162 lb (73.483 kg)  12/20/14 169 lb 6.4 oz (76.839 kg)  11/19/14 160 lb (72.576 kg)      Studies/Labs Reviewed:   EKG:  EKG  Is performed and reveals left atrial abnormality, PACs, prominent voltage, leftward axis, and nonspecific T-wave abnormality.  Recent Labs: 12/21/2014: BUN 15; Creatinine, Ser 1.30*; Hemoglobin 11.2*; Potassium 4.1; Sodium 139   Lipid Panel    Component Value Date/Time   CHOL  12/16/2006 2350    158        ATP III CLASSIFICATION:  <200     mg/dL   Desirable  200-239  mg/dL   Borderline High  >=240    mg/dL   High   TRIG 101 12/16/2006 2350   HDL 41 12/16/2006 2350   CHOLHDL 3.9 12/16/2006 2350   VLDL  20 12/16/2006 2350   LDLCALC  12/16/2006 2350    97        Total Cholesterol/HDL:CHD Risk Coronary Heart Disease Risk Table                     Men   Women  1/2 Average Risk   3.4   3.3    Additional studies/ records that were reviewed today include:  2008 echocardiogram: SUMMARY - Overall left ventricular systolic function was normal. Left    ventricular ejection fraction was estimated to be 60 %. There    was no diagnostic evidence of left ventricular regional wall    motion abnormalities. Left ventricular wall thickness was    moderately increased. - There was mild aortic root dilatation. - There is mild prolapse of the posterior leaflet of the mitral    valve. There was moderate mitral valvular regurgitation. - The left atrium was dilated.    ASSESSMENT:    1. Essential hypertension   2. Paroxysmal supraventricular tachycardia ([place])   3. MVP (mitral valve prolapse)   4. Chronic kidney disease, stage III (moderate)      PLAN:  In order of problems listed above:  1. Well controlled. Low salt diet is recommended. 2. No symptomatic episodes of tachycardia. 3. Significant mitral regurgitation murmurs heard. No clinical evidence of volume overload. We will do a 2-D Doppler echocardiogram to assess where we are since he is functionally doing so well. Warned that if he develops dyspnea, edema, orthopnea, or chest pain that he should notify us. 4. Chronic kidney disease is not evaluated.    Medication Adjustments/Labs and Tests Ordered: Current medicines are reviewed at length with the patient today.  Concerns regarding medicines are outlined above.  Medication changes, Labs and Tests ordered today are listed in the Patient Instructions below. There are no Patient Instructions on file for this visit.   Signed, Sinclair Grooms, MD  10/02/2015 9:47 AM    [facility] [address], [place],   [address] Phone: [phone]; Fax: [phone]

## 2015-10-02 NOTE — Patient Instructions (Signed)
Medication Instructions:  Your physician recommends that you continue on your current medications as directed. Please refer to the Current Medication list given to you today.   Labwork: None ordered  Testing/Procedures: Your physician has requested that you have an echocardiogram. Echocardiography is a painless test that uses sound waves to create images of your heart. It provides your doctor with information about the size and shape of your heart and how well your heart's chambers and valves are working. This procedure takes approximately one hour. There are no restrictions for this procedure.   Follow-Up: Your physician wants you to follow-up in: 1 year with Dr.Smith You will receive a reminder letter in the mail two months in advance. If you don't receive a letter, please call our office to schedule the follow-up appointment.   Any Other Special Instructions Will Be Listed Below (If Applicable). Call the office if you develop increased shortness of breath or swelling     If you need a refill on your cardiac medications before your next appointment, please call your pharmacy.

## 2015-10-23 ENCOUNTER — Telehealth: Payer: Self-pay | Admitting: Physician Assistant

## 2015-10-23 NOTE — Telephone Encounter (Signed)
Pt called w/ concerns about echo tomorrow.  Info given. Pt understands.  Rosaria Ferries, PA-C 10/23/2015 6:00 PM [facility] [phone]

## 2015-10-24 ENCOUNTER — Ambulatory Visit (HOSPITAL_COMMUNITY): Payer: Medicare Other | Attending: Cardiology

## 2015-10-24 ENCOUNTER — Other Ambulatory Visit: Payer: Self-pay

## 2015-10-24 DIAGNOSIS — I341 Nonrheumatic mitral (valve) prolapse: Secondary | ICD-10-CM | POA: Insufficient documentation

## 2015-10-24 DIAGNOSIS — I517 Cardiomegaly: Secondary | ICD-10-CM | POA: Diagnosis not present

## 2015-10-24 DIAGNOSIS — I340 Nonrheumatic mitral (valve) insufficiency: Secondary | ICD-10-CM | POA: Insufficient documentation

## 2015-10-24 DIAGNOSIS — I77810 Thoracic aortic ectasia: Secondary | ICD-10-CM | POA: Diagnosis not present

## 2015-10-24 DIAGNOSIS — I059 Rheumatic mitral valve disease, unspecified: Secondary | ICD-10-CM | POA: Diagnosis present

## 2015-10-24 DIAGNOSIS — I071 Rheumatic tricuspid insufficiency: Secondary | ICD-10-CM | POA: Insufficient documentation

## 2015-10-24 LAB — ECHOCARDIOGRAM COMPLETE
AOASC: 35 cm
AVLVOTPG: 3 mmHg
CHL CUP DOP CALC LVOT VTI: 13.4 cm
CHL CUP TV REG PEAK VELOCITY: 339 cm/s
EERAT: 9.69
EWDT: 246 ms
FS: 43 % (ref 28–44)
IVS/LV PW RATIO, ED: 1.15
LA diam end sys: 49 mm
LADIAMINDEX: 2.55 cm/m2
LASIZE: 49 mm
LAVOL: 95 mL
LAVOLA4C: 88 mL
LAVOLIN: 49.5 mL/m2
LV E/e' medial: 9.69
LV PW d: 11.4 mm — AB (ref 0.6–1.1)
LV e' LATERAL: 9.94 cm/s
LVEEAVG: 9.69
LVOT SV: 51 mL
LVOT area: 3.8 cm2
LVOT diameter: 22 mm
LVOT peak vel: 86.5 cm/s
MV Dec: 246
MVPG: 4 mmHg
MVPKAVEL: 58.2 m/s
MVPKEVEL: 96.3 m/s
TDI e' lateral: 9.94
TDI e' medial: 6.27
TRMAXVEL: 339 cm/s
VTI: 182 cm

## 2016-01-03 ENCOUNTER — Emergency Department (HOSPITAL_COMMUNITY): Payer: Medicare Other

## 2016-01-03 ENCOUNTER — Encounter (HOSPITAL_COMMUNITY): Payer: Self-pay | Admitting: Emergency Medicine

## 2016-01-03 ENCOUNTER — Emergency Department (HOSPITAL_COMMUNITY)
Admission: EM | Admit: 2016-01-03 | Discharge: 2016-01-04 | Disposition: A | Discharge status: Home / Self Care | Payer: Medicare Other | Attending: Emergency Medicine | Admitting: Emergency Medicine

## 2016-01-03 DIAGNOSIS — N183 Chronic kidney disease, stage 3 (moderate): Secondary | ICD-10-CM | POA: Insufficient documentation

## 2016-01-03 DIAGNOSIS — C799 Secondary malignant neoplasm of unspecified site: Secondary | ICD-10-CM | POA: Diagnosis not present

## 2016-01-03 DIAGNOSIS — I129 Hypertensive chronic kidney disease with stage 1 through stage 4 chronic kidney disease, or unspecified chronic kidney disease: Secondary | ICD-10-CM | POA: Insufficient documentation

## 2016-01-03 DIAGNOSIS — R109 Unspecified abdominal pain: Secondary | ICD-10-CM | POA: Diagnosis present

## 2016-01-03 DIAGNOSIS — Z8551 Personal history of malignant neoplasm of bladder: Secondary | ICD-10-CM | POA: Insufficient documentation

## 2016-01-03 DIAGNOSIS — Z8546 Personal history of malignant neoplasm of prostate: Secondary | ICD-10-CM | POA: Insufficient documentation

## 2016-01-03 DIAGNOSIS — R1084 Generalized abdominal pain: Secondary | ICD-10-CM | POA: Insufficient documentation

## 2016-01-03 LAB — LIPASE, BLOOD: LIPASE: 19 U/L (ref 11–51)

## 2016-01-03 LAB — COMPREHENSIVE METABOLIC PANEL
ALBUMIN: 2.8 g/dL — AB (ref 3.5–5.0)
ALT: 25 U/L (ref 17–63)
AST: 59 U/L — AB (ref 15–41)
Alkaline Phosphatase: 157 U/L — ABNORMAL HIGH (ref 38–126)
Anion gap: 13 (ref 5–15)
BUN: 38 mg/dL — AB (ref 6–20)
CHLORIDE: 104 mmol/L (ref 101–111)
CO2: 22 mmol/L (ref 22–32)
Calcium: 8.6 mg/dL — ABNORMAL LOW (ref 8.9–10.3)
Creatinine, Ser: 1.87 mg/dL — ABNORMAL HIGH (ref 0.61–1.24)
GFR calc Af Amer: 35 mL/min — ABNORMAL LOW (ref >60)
GFR calc non Af Amer: 30 mL/min — ABNORMAL LOW (ref >60)
GLUCOSE: 114 mg/dL — AB (ref 65–99)
POTASSIUM: 4.1 mmol/L (ref 3.5–5.1)
SODIUM: 139 mmol/L (ref 135–145)
Total Bilirubin: 1.1 mg/dL (ref 0.3–1.2)
Total Protein: 7 g/dL (ref 6.5–8.1)

## 2016-01-03 LAB — CBC WITH DIFFERENTIAL/PLATELET
BASOS ABS: 0 10*3/uL (ref 0.0–0.1)
BASOS PCT: 0 %
EOS PCT: 0 %
Eosinophils Absolute: 0.1 10*3/uL (ref 0.0–0.7)
HCT: 36.8 % — ABNORMAL LOW (ref 39.0–52.0)
Hemoglobin: 11.7 g/dL — ABNORMAL LOW (ref 13.0–17.0)
Lymphocytes Relative: 6 %
Lymphs Abs: 0.9 10*3/uL (ref 0.7–4.0)
MCH: 29 pg (ref 26.0–34.0)
MCHC: 31.8 g/dL (ref 30.0–36.0)
MCV: 91.1 fL (ref 78.0–100.0)
MONO ABS: 1.3 10*3/uL — AB (ref 0.1–1.0)
Monocytes Relative: 9 %
NEUTROS ABS: 11.7 10*3/uL — AB (ref 1.7–7.7)
Neutrophils Relative %: 85 %
PLATELETS: 300 10*3/uL (ref 150–400)
RBC: 4.04 MIL/uL — ABNORMAL LOW (ref 4.22–5.81)
RDW: 13.6 % (ref 11.5–15.5)
WBC: 13.9 10*3/uL — ABNORMAL HIGH (ref 4.0–10.5)

## 2016-01-03 NOTE — ED Provider Notes (Signed)
[facility] DEPT Provider Note   CSN: [id number] Arrival date & time: 01/03/16  1936  By signing my name below, I, Estanislado Pandy, attest that this documentation has been prepared under the direction and in the presence of Merryl Hacker, MD . Electronically Signed: Estanislado Pandy, Scribe. 01/03/2016. 11:39 PM.    History   Chief Complaint Chief Complaint  Patient presents with  . Abdominal Pain  . Shortness of Breath  . Fatigue  . Hypotension    The history is provided by the patient. No language interpreter was used.   HPI Comments:  Shawn Booker is a 82 y.o. male with PMHx of anemia, CKD, Prostate cancer and HTN who presents to the Emergency Department with multiple complaints. Pt complains of intermittent, recently improving abdominal pain x3 weeks. Pt describes the pain as "cramping and bloating." Pt states that today has been one of his better days and that the abdominal pain has started to resolve. Pt reports associated decreased appetite and fatigue upon exertion, and that he is currently hungry and thirsty. Pt notes that he has changed his physical activity and diet since the pain onset.  Pt reports that he was unable to see his PCP today, and then saw a different PCP that was concerned that he was not having normal BM. Pt notes that his stools have been hard. Pt is a vegeterian. Pt denies other current pain, fever, or urinary symptoms.   Past Medical History:  Diagnosis Date  . Anemia   . Bladder cancer ([place])   . CKD (chronic kidney disease)   . H/O osteopenia   . HTN (hypertension)   . MVP (mitral valve prolapse)   . Paroxysmal supraventricular tachycardia ([place])   . Prostate cancer [facility])     Patient Active Problem List   Diagnosis Date Noted  . Chronic kidney disease, stage III (moderate) 04/20/2013  . MVP (mitral valve prolapse)   . Paroxysmal supraventricular tachycardia ([place])   . HTN (hypertension)   . Prostate cancer ([place])   . Bladder cancer ([place])    . Anemia     Past Surgical History:  Procedure Laterality Date  . Bladder Tumor, Cysto with fulguration    . HERNIA REPAIR    . TRANSURETHRAL RESECTION OF PROSTATE         Home Medications    Prior to Admission medications   Medication Sig Start Date End Date Taking? Authorizing Provider  Coenzyme Q10 200 MG capsule Take 200 mg by mouth daily.   Yes Historical Provider, MD  Ergocalciferol (VITAMIN D2) 400 UNITS TABS Take 1 tablet by mouth daily.   Yes Historical Provider, MD  furosemide (LASIX) 80 MG tablet Take 80 mg by mouth daily.    Yes Historical Provider, MD  Multiple Vitamin (MULTIVITAMIN WITH MINERALS) TABS tablet Take 1 tablet by mouth daily.   Yes Historical Provider, MD  HYDROcodone-acetaminophen (NORCO/VICODIN) 5-325 MG tablet Take 1 tablet by mouth every 6 (six) hours as needed. 01/04/16   Merryl Hacker, MD    Family History Family History  Problem Relation Age of Onset  . Heart disease Mother     Social History Social History  Substance Use Topics  . Smoking status: Never Smoker  . Smokeless tobacco: Never Used  . Alcohol use No     Allergies   Review of patient's allergies indicates no known allergies.   Review of Systems Review of Systems  Constitutional: Positive for fatigue. Negative for fever.  Gastrointestinal: Positive for abdominal  pain and constipation. Negative for diarrhea, nausea and vomiting.  Genitourinary: Negative for dysuria.  All other systems reviewed and are negative.    Physical Exam Updated Vital Signs BP 116/64   Pulse 84   Temp 98.4 F (36.9 C) (Oral)   Resp 16   Ht 5\' 5"  (1.651 m)   Wt 164 lb (74.4 kg)   SpO2 99%   BMI 27.29 kg/m   Physical Exam  Constitutional: He is oriented to person, place, and time.  Elderly, no acute distress  HENT:  Head: Normocephalic and atraumatic.  Mucous membranes dry  Cardiovascular: Normal rate, regular rhythm and normal heart sounds.   No murmur heard. Pulmonary/Chest:  Effort normal and breath sounds normal. No respiratory distress. He has no wheezes.  Abdominal: Soft. Bowel sounds are normal. He exhibits no mass. There is no tenderness. There is no rebound.  Protuberant, soft  Neurological: He is alert and oriented to person, place, and time.  Skin: Skin is warm and dry.  Psychiatric: He has a normal mood and affect.  Nursing note and vitals reviewed.    ED Treatments / Results  DIAGNOSTIC STUDIES:  Oxygen Saturation is 99% on RA, normal by my interpretation.    COORDINATION OF CARE:  11:39 PM Discussed treatment plan with pt at bedside and pt agreed to plan.  Labs (all labs ordered are listed, but only abnormal results are displayed) Labs Reviewed  CBC WITH DIFFERENTIAL/PLATELET - Abnormal; Notable for the following:       Result Value   WBC 13.9 (*)    RBC 4.04 (*)    Hemoglobin 11.7 (*)    HCT 36.8 (*)    Neutro Abs 11.7 (*)    Monocytes Absolute 1.3 (*)    All other components within normal limits  COMPREHENSIVE METABOLIC PANEL - Abnormal; Notable for the following:    Glucose, Bld 114 (*)    BUN 38 (*)    Creatinine, Ser 1.87 (*)    Calcium 8.6 (*)    Albumin 2.8 (*)    AST 59 (*)    Alkaline Phosphatase 157 (*)    GFR calc non Af Amer 30 (*)    GFR calc Af Amer 35 (*)    All other components within normal limits  URINALYSIS, ROUTINE W REFLEX MICROSCOPIC (NOT AT [facility])  LIPASE, BLOOD    EKG  EKG Interpretation  Date/Time:  Wednesday January 03 2016 19:46:18 EDT Ventricular Rate:  88 PR Interval:    QRS Duration: 84 QT Interval:  420 QTC Calculation: 508 R Axis:   -2 Text Interpretation:  Atrial fibrillation with premature ventricular or aberrantly conducted complexes T wave abnormality, consider anterior ischemia Prolonged QT Abnormal ECG Confirmed by Mansi Tokar  MD, Malgorzata Albert ([id number]) on 01/04/2016 12:52:35 AM       Radiology Ct Abdomen Pelvis Wo Contrast  Result Date: 01/04/2016 CLINICAL DATA:  Abdominal pain.  No  diarrhea or vomiting EXAM: CT ABDOMEN AND PELVIS WITHOUT CONTRAST TECHNIQUE: Multidetector CT imaging of the abdomen and pelvis was performed following the standard protocol without IV contrast. COMPARISON:  CT abdomen pelvis 11/15/2013, CT pelvis 05/22/2015 FINDINGS: Lower chest: Focal area of consolidation in the posterior right lower lobe. No pleural effusion. Heart is enlarged. Hepatobiliary: There are numerous hypo attenuating masses throughout the liver, which are new compared to the CT of 05/22/2015. The largest is located in the left hepatic lobe measures up to 7.3 cm in greatest transverse dimension. The gallbladder is nondistended. There is small  volume perihepatic ascites. Pancreas: There are multiple calcifications of the atrophic pancreas. Spleen: Normal. Adrenals/Urinary Tract: Normal adrenal glands. There are multiple bilateral renal cysts. There is left-greater-than-right advanced cortical renal atrophy. Stomach/Bowel: No abnormal bowel dilatation. No bowel wall thickening or adjacent fat stranding to indicate acute inflammation. No abdominal fluid collection. Vascular/Lymphatic: Mild aortic atherosclerotic calcification. No abdominal aortic aneurysm. Multiple subcentimeter retroperitoneal lymph nodes. Reproductive: Size of the prostate is normal. Seminal vesicles are unremarkable. Small amount of fluid within the left pelvis Musculoskeletal: Multilevel severe facet arthrosis and osteophytosis. No advanced bony canal stenosis. Bilateral sacroiliac bridging osteophytes. No lytic or blastic osseous lesions. There is a soft tissue attenuation focus within the anterior abdominal wall (series 2, image 35) measuring 16 x 8 mm. Other: No contributory non-categorized findings. IMPRESSION: 1. Diffuse hepatic metastatic disease, new from the prior examination. Small volume perihepatic ascites. 2. Soft tissue nodule within the subcutaneous fat of the anterior abdominal wall is also concerning for metastatic  disease, though the appearance is somewhat nonspecific. 3. Focal area of consolidation in the right lower lobe. This could indicate developing infection. Electronically Signed   By: Ulyses Jarred M.D.   On: 01/04/2016 03:11   Dg Chest 2 View  Result Date: 01/03/2016 CLINICAL DATA:  82 year old male with shortness of breath EXAM: CHEST  2 VIEW COMPARISON:  Chest radiograph dated 05/24/2008 FINDINGS: There is mild cardiomegaly with mild central vascular congestion. No pulmonary edema. Mild eventration of the left hemidiaphragm with minimal left lung base atelectatic changes. There is no focal consolidation, pleural effusion, or pneumothorax. No acute osseous pathology identified. IMPRESSION: Mild cardiomegaly with mild vascular congestion. No focal consolidation or edema. Electronically Signed   By: Anner Crete M.D.   On: 01/03/2016 20:25    Procedures Procedures (including critical care time)  Medications Ordered in ED Medications  iopamidol (ISOVUE-300) 61 % injection (not administered)     Initial Impression / Assessment and Plan / ED Course  I have reviewed the triage vital signs and the nursing notes.  Pertinent labs & imaging results that were available during my care of the patient were reviewed by me and considered in my medical decision making (see chart for details).  Clinical Course    Patient presents with abdominal pain. Actually states that he feels better today but the doctor he saw was concerned and sent him here for further evaluation. He is nontoxic. Abdomen is mildly protuberant but otherwise nontender. He has a mild leukocytosis. Otherwise his lab work is at baseline. CT scan of the abdomen reveals metastatic lesions to the liver and anterior abdominal wall. I discussed the results with the patient. We discussed options. Patient is comfortable and in no pain right now. Has a history of prostate cancer and is followed by Dr. Jeffie Pollock.  A shunt would like to follow-up as an  outpatient with Dr. Jeffie Pollock.  Patient does not have a ride home and does not wish to make his daughter up in the middle of the night. I have allowed him to rest comfortably in the emergency department. His daughter was called this morning. Patient requests that he be allowed to discuss his diagnosis with his daughter.  Final Clinical Impressions(s) / ED Diagnoses   Final diagnoses:  Metastatic cancer ([place])  Generalized abdominal pain    New Prescriptions New Prescriptions   HYDROCODONE-ACETAMINOPHEN (NORCO/VICODIN) 5-325 MG TABLET    Take 1 tablet by mouth every 6 (six) hours as needed.   I personally performed the services described  in this documentation, which was scribed in my presence. The recorded information has been reviewed and is accurate.     Merryl Hacker, MD 01/04/16 [phone]

## 2016-01-03 NOTE — ED Triage Notes (Addendum)
Pt. presents with multiple complaints : Intermittent  mid/low abdominal pain for several weeks , denies emesis or diarrhea , SOB worse with exertion , fatigue and hypotension today . Denies fever or chills. No pain at arrival .

## 2016-01-04 ENCOUNTER — Emergency Department (HOSPITAL_COMMUNITY): Payer: Medicare Other

## 2016-01-04 LAB — URINALYSIS, ROUTINE W REFLEX MICROSCOPIC
Bilirubin Urine: NEGATIVE
GLUCOSE, UA: NEGATIVE mg/dL
HGB URINE DIPSTICK: NEGATIVE
KETONES UR: NEGATIVE mg/dL
LEUKOCYTES UA: NEGATIVE
Nitrite: NEGATIVE
PROTEIN: NEGATIVE mg/dL
Specific Gravity, Urine: 1.024 (ref 1.005–1.030)
pH: 5.5 (ref 5.0–8.0)

## 2016-01-04 MED ORDER — HYDROCODONE-ACETAMINOPHEN 5-325 MG PO TABS: 1.0000 | ORAL_TABLET | Freq: Four times a day (QID) | ORAL | 0 refills | Status: AC | PRN

## 2016-01-04 MED ORDER — IOPAMIDOL (ISOVUE-300) INJECTION 61%
INTRAVENOUS | Status: AC
  Filled 2016-01-04: qty 30

## 2016-01-04 NOTE — ED Notes (Signed)
Pt dressed and ready. Requesting to sit off the monitor in the chair to wait for his daughter. All patient's needs within reach. Pt trying to get a hold of the daughter. Requests that no MD speaks with daughter about diagnosis and he will speak with her later when they get home. Pt given coffee and waiting for vegetarian meal tray.

## 2016-01-04 NOTE — ED Notes (Signed)
Patient transported to CT 

## 2016-01-04 NOTE — ED Notes (Signed)
Meal Tray ordered for patient while he waits for his daughter.

## 2016-01-04 NOTE — ED Notes (Signed)
Patient given oral contrast by CT tech.

## 2016-01-04 NOTE — ED Notes (Signed)
Pt waiting on Daughter to pick him up

## 2016-01-04 NOTE — Discharge Instructions (Signed)
You were seen today for abdominal pain. Unfortunately, your CT scan shows metastasis to the liver. This is likely related to your known prostate cancer. You need to follow-up closely with Dr. Jeffie Pollock.

## 2016-01-15 ENCOUNTER — Inpatient Hospital Stay (HOSPITAL_COMMUNITY)
Admission: EM | Admit: 2016-01-15 | Discharge: 2016-01-28 | DRG: 853 | Disposition: E | Payer: Medicare Other | Attending: Internal Medicine | Admitting: Internal Medicine

## 2016-01-15 ENCOUNTER — Encounter (HOSPITAL_COMMUNITY): Payer: Self-pay | Admitting: Emergency Medicine

## 2016-01-15 ENCOUNTER — Emergency Department (HOSPITAL_COMMUNITY): Payer: Medicare Other

## 2016-01-15 DIAGNOSIS — R339 Retention of urine, unspecified: Secondary | ICD-10-CM | POA: Diagnosis not present

## 2016-01-15 DIAGNOSIS — M79672 Pain in left foot: Secondary | ICD-10-CM

## 2016-01-15 DIAGNOSIS — K625 Hemorrhage of anus and rectum: Secondary | ICD-10-CM | POA: Diagnosis present

## 2016-01-15 DIAGNOSIS — D649 Anemia, unspecified: Secondary | ICD-10-CM | POA: Diagnosis present

## 2016-01-15 DIAGNOSIS — I341 Nonrheumatic mitral (valve) prolapse: Secondary | ICD-10-CM | POA: Diagnosis present

## 2016-01-15 DIAGNOSIS — J9601 Acute respiratory failure with hypoxia: Secondary | ICD-10-CM | POA: Diagnosis not present

## 2016-01-15 DIAGNOSIS — K922 Gastrointestinal hemorrhage, unspecified: Secondary | ICD-10-CM

## 2016-01-15 DIAGNOSIS — D696 Thrombocytopenia, unspecified: Secondary | ICD-10-CM | POA: Diagnosis present

## 2016-01-15 DIAGNOSIS — R209 Unspecified disturbances of skin sensation: Secondary | ICD-10-CM | POA: Diagnosis not present

## 2016-01-15 DIAGNOSIS — Z6824 Body mass index (BMI) 24.0-24.9, adult: Secondary | ICD-10-CM

## 2016-01-15 DIAGNOSIS — C787 Secondary malignant neoplasm of liver and intrahepatic bile duct: Secondary | ICD-10-CM | POA: Diagnosis present

## 2016-01-15 DIAGNOSIS — I129 Hypertensive chronic kidney disease with stage 1 through stage 4 chronic kidney disease, or unspecified chronic kidney disease: Secondary | ICD-10-CM | POA: Diagnosis present

## 2016-01-15 DIAGNOSIS — C61 Malignant neoplasm of prostate: Secondary | ICD-10-CM | POA: Diagnosis present

## 2016-01-15 DIAGNOSIS — A419 Sepsis, unspecified organism: Principal | ICD-10-CM | POA: Diagnosis present

## 2016-01-15 DIAGNOSIS — C786 Secondary malignant neoplasm of retroperitoneum and peritoneum: Secondary | ICD-10-CM | POA: Diagnosis present

## 2016-01-15 DIAGNOSIS — K921 Melena: Secondary | ICD-10-CM | POA: Diagnosis present

## 2016-01-15 DIAGNOSIS — I82403 Acute embolism and thrombosis of unspecified deep veins of lower extremity, bilateral: Secondary | ICD-10-CM

## 2016-01-15 DIAGNOSIS — E43 Unspecified severe protein-calorie malnutrition: Secondary | ICD-10-CM | POA: Diagnosis present

## 2016-01-15 DIAGNOSIS — I82433 Acute embolism and thrombosis of popliteal vein, bilateral: Secondary | ICD-10-CM | POA: Diagnosis present

## 2016-01-15 DIAGNOSIS — D62 Acute posthemorrhagic anemia: Secondary | ICD-10-CM | POA: Diagnosis present

## 2016-01-15 DIAGNOSIS — Z8546 Personal history of malignant neoplasm of prostate: Secondary | ICD-10-CM

## 2016-01-15 DIAGNOSIS — N183 Chronic kidney disease, stage 3 unspecified: Secondary | ICD-10-CM | POA: Diagnosis present

## 2016-01-15 DIAGNOSIS — R52 Pain, unspecified: Secondary | ICD-10-CM

## 2016-01-15 DIAGNOSIS — I82409 Acute embolism and thrombosis of unspecified deep veins of unspecified lower extremity: Secondary | ICD-10-CM | POA: Diagnosis present

## 2016-01-15 DIAGNOSIS — R0682 Tachypnea, not elsewhere classified: Secondary | ICD-10-CM

## 2016-01-15 DIAGNOSIS — M79671 Pain in right foot: Secondary | ICD-10-CM

## 2016-01-15 DIAGNOSIS — Z8249 Family history of ischemic heart disease and other diseases of the circulatory system: Secondary | ICD-10-CM

## 2016-01-15 DIAGNOSIS — E872 Acidosis, unspecified: Secondary | ICD-10-CM | POA: Diagnosis present

## 2016-01-15 DIAGNOSIS — N179 Acute kidney failure, unspecified: Secondary | ICD-10-CM

## 2016-01-15 DIAGNOSIS — Z79899 Other long term (current) drug therapy: Secondary | ICD-10-CM

## 2016-01-15 DIAGNOSIS — I82411 Acute embolism and thrombosis of right femoral vein: Secondary | ICD-10-CM | POA: Diagnosis present

## 2016-01-15 DIAGNOSIS — N170 Acute kidney failure with tubular necrosis: Secondary | ICD-10-CM | POA: Diagnosis present

## 2016-01-15 DIAGNOSIS — I82443 Acute embolism and thrombosis of tibial vein, bilateral: Secondary | ICD-10-CM | POA: Diagnosis present

## 2016-01-15 DIAGNOSIS — R571 Hypovolemic shock: Secondary | ICD-10-CM | POA: Diagnosis present

## 2016-01-15 DIAGNOSIS — R791 Abnormal coagulation profile: Secondary | ICD-10-CM | POA: Diagnosis present

## 2016-01-15 DIAGNOSIS — Z8551 Personal history of malignant neoplasm of bladder: Secondary | ICD-10-CM

## 2016-01-15 DIAGNOSIS — R6521 Severe sepsis with septic shock: Secondary | ICD-10-CM

## 2016-01-15 LAB — BASIC METABOLIC PANEL
ANION GAP: 15 (ref 5–15)
BUN: 101 mg/dL — ABNORMAL HIGH (ref 6–20)
CO2: 18 mmol/L — AB (ref 22–32)
Calcium: 8.4 mg/dL — ABNORMAL LOW (ref 8.9–10.3)
Chloride: 100 mmol/L — ABNORMAL LOW (ref 101–111)
Creatinine, Ser: 3.13 mg/dL — ABNORMAL HIGH (ref 0.61–1.24)
GFR calc Af Amer: 19 mL/min — ABNORMAL LOW (ref >60)
GFR calc non Af Amer: 16 mL/min — ABNORMAL LOW (ref >60)
GLUCOSE: 141 mg/dL — AB (ref 65–99)
POTASSIUM: 5 mmol/L (ref 3.5–5.1)
Sodium: 133 mmol/L — ABNORMAL LOW (ref 135–145)

## 2016-01-15 LAB — POC OCCULT BLOOD, ED: Fecal Occult Bld: POSITIVE — AB

## 2016-01-15 LAB — URINALYSIS, ROUTINE W REFLEX MICROSCOPIC
BILIRUBIN URINE: NEGATIVE
GLUCOSE, UA: NEGATIVE mg/dL
HGB URINE DIPSTICK: NEGATIVE
KETONES UR: NEGATIVE mg/dL
Leukocytes, UA: NEGATIVE
NITRITE: NEGATIVE
PH: 5 (ref 5.0–8.0)
Protein, ur: NEGATIVE mg/dL
Specific Gravity, Urine: 1.013 (ref 1.005–1.030)

## 2016-01-15 LAB — HEPATIC FUNCTION PANEL
ALK PHOS: 198 U/L — AB (ref 38–126)
ALT: 69 U/L — ABNORMAL HIGH (ref 17–63)
AST: 150 U/L — ABNORMAL HIGH (ref 15–41)
Albumin: 2.6 g/dL — ABNORMAL LOW (ref 3.5–5.0)
BILIRUBIN INDIRECT: 1.1 mg/dL — AB (ref 0.3–0.9)
BILIRUBIN TOTAL: 2.6 mg/dL — AB (ref 0.3–1.2)
Bilirubin, Direct: 1.5 mg/dL — ABNORMAL HIGH (ref 0.1–0.5)
TOTAL PROTEIN: 6.3 g/dL — AB (ref 6.5–8.1)

## 2016-01-15 LAB — CBC
HEMATOCRIT: 37.2 % — AB (ref 39.0–52.0)
Hemoglobin: 12.1 g/dL — ABNORMAL LOW (ref 13.0–17.0)
MCH: 29.2 pg (ref 26.0–34.0)
MCHC: 32.5 g/dL (ref 30.0–36.0)
MCV: 89.9 fL (ref 78.0–100.0)
Platelets: 133 10*3/uL — ABNORMAL LOW (ref 150–400)
RBC: 4.14 MIL/uL — ABNORMAL LOW (ref 4.22–5.81)
RDW: 15.9 % — ABNORMAL HIGH (ref 11.5–15.5)
WBC: 16.9 10*3/uL — ABNORMAL HIGH (ref 4.0–10.5)

## 2016-01-15 LAB — CBG MONITORING, ED: Glucose-Capillary: 113 mg/dL — ABNORMAL HIGH (ref 65–99)

## 2016-01-15 LAB — I-STAT TROPONIN, ED: Troponin i, poc: 0.04 ng/mL (ref 0.00–0.08)

## 2016-01-15 LAB — I-STAT CG4 LACTIC ACID, ED: LACTIC ACID, VENOUS: 4.08 mmol/L — AB (ref 0.5–1.9)

## 2016-01-15 MED ORDER — SODIUM CHLORIDE 0.9 % IV BOLUS (SEPSIS)
1000.0000 mL | Freq: Once | INTRAVENOUS | Status: AC
  Administered 2016-01-15: 1000 mL via INTRAVENOUS

## 2016-01-15 MED ORDER — NOREPINEPHRINE BITARTRATE 1 MG/ML IV SOLN
0.0000 ug/min | Freq: Once | INTRAVENOUS | Status: AC
  Administered 2016-01-15: 10 ug/min via INTRAVENOUS
  Filled 2016-01-15: qty 4

## 2016-01-15 MED ORDER — VANCOMYCIN HCL IN DEXTROSE 1-5 GM/200ML-% IV SOLN
1000.0000 mg | INTRAVENOUS | Status: DC
  Administered 2016-01-17: 1000 mg via INTRAVENOUS
  Filled 2016-01-15: qty 200

## 2016-01-15 MED ORDER — PIPERACILLIN-TAZOBACTAM 3.375 G IVPB 30 MIN
3.3750 g | Freq: Once | INTRAVENOUS | Status: AC
  Administered 2016-01-15: 3.375 g via INTRAVENOUS
  Filled 2016-01-15: qty 50

## 2016-01-15 MED ORDER — VANCOMYCIN HCL IN DEXTROSE 1-5 GM/200ML-% IV SOLN
1000.0000 mg | Freq: Once | INTRAVENOUS | Status: AC
  Administered 2016-01-15: 1000 mg via INTRAVENOUS
  Filled 2016-01-15: qty 200

## 2016-01-15 NOTE — ED Notes (Signed)
Resident aware of pt's blood pressure

## 2016-01-15 NOTE — ED Notes (Signed)
Re paged Critical Care for Dr. Sabra Heck- [place]

## 2016-01-15 NOTE — ED Notes (Signed)
Pt's wife thinks he's a direct admit.  She is calling md.

## 2016-01-15 NOTE — ED Provider Notes (Signed)
Shawn Booker DEPT Provider Note   CSN: [id number] Arrival date & time: 01/04/2016  1736   History   Chief Complaint Chief Complaint  Patient presents with  . Shortness of Breath  . Leg Swelling    HPI Shawn Booker is a 82 y.o. male.  The history is provided by the patient, a relative and medical records.   82 year old male with history of prostate cancer, recent diagnosis of metastatic disease in the liver and peritoneum, paroxysmal SVT, CKD, hypertension, anemia, mitral valve prolapse presenting with bilateral foot pain. Onset yesterday. Worsening since then. Located in bilateral forefoot in all toes. Described as severe burning pain and dusky discoloration. Associated with edema bilaterally, slightly worse on the left. Worse with movement of feet/toes. Daughter noticed patient's feet and insisted on bringing him in due to concern that there is vascular compromise to feet.  Daughter also reports patient has been increasingly weak. Weakness is generalized. Worse with exertion. Associated with tachypnea noted by daughter earlier today. Patient denies these complaints. States he is somewhat tired, but denies any fevers, cough, shortness breath, chest pain, abdominal pain, nausea/vomiting. He does admit to some loose bowel movements earlier today that had bright red blood in them, which is a new problem. He also notes that he has been making some urine today but it is somewhat decreased.    Past Medical History:  Diagnosis Date  . Anemia   . Bladder cancer ([place])   . CKD (chronic kidney disease)   . H/O osteopenia   . HTN (hypertension)   . MVP (mitral valve prolapse)   . Paroxysmal supraventricular tachycardia ([place])   . Prostate cancer [facility])     Patient Active Problem List   Diagnosis Date Noted  . Chronic kidney disease, stage III (moderate) 04/20/2013  . MVP (mitral valve prolapse)   . Paroxysmal supraventricular tachycardia ([place])   . HTN (hypertension)   . Prostate cancer  ([place])   . Bladder cancer ([place])   . Anemia     Past Surgical History:  Procedure Laterality Date  . Bladder Tumor, Cysto with fulguration    . HERNIA REPAIR    . TRANSURETHRAL RESECTION OF PROSTATE         Home Medications    Prior to Admission medications   Medication Sig Start Date End Date Taking? Authorizing Provider  Bioflavonoid Products (ESTER-C) 500-200-60 MG TABS Take 2 tablets by mouth daily.   Yes Historical Provider, MD  furosemide (LASIX) 80 MG tablet Take 80 mg by mouth 2 (two) times daily.    Yes Historical Provider, MD  OVER THE COUNTER MEDICATION Take 1 tablet by mouth daily.   Yes Historical Provider, MD  Ubiquinol 100 MG CAPS Take 100 mg by mouth daily.   Yes Historical Provider, MD  HYDROcodone-acetaminophen (NORCO/VICODIN) 5-325 MG tablet Take 1 tablet by mouth every 6 (six) hours as needed. Patient not taking: Reported on 01/25/2016 01/04/16   Merryl Hacker, MD    Family History Family History  Problem Relation Age of Onset  . Heart disease Mother     Social History Social History  Substance Use Topics  . Smoking status: Never Smoker  . Smokeless tobacco: Never Used  . Alcohol use No     Allergies   Review of patient's allergies indicates no known allergies.   Review of Systems Review of Systems  Constitutional: Positive for fatigue. Negative for chills and fever.  Respiratory: Negative for cough and shortness of breath.   Cardiovascular: Positive  for leg swelling. Negative for chest pain.  Gastrointestinal: Positive for blood in stool and diarrhea. Negative for abdominal pain, nausea, rectal pain and vomiting.  Genitourinary: Positive for decreased urine volume (making small amounts of urine today). Negative for dysuria and hematuria.  Musculoskeletal: Positive for arthralgias.  Skin: Positive for color change.  Allergic/Immunologic: Negative for immunocompromised state.  Neurological: Positive for weakness. Negative for syncope,  light-headedness and headaches.  Hematological: Does not bruise/bleed easily.  Psychiatric/Behavioral: Negative for confusion.  All other systems reviewed and are negative.    Physical Exam Updated Vital Signs BP (!) 86/61   Pulse (!) 124   Temp 98.4 F (36.9 C) (Rectal)   Resp 15   Wt 68 kg   SpO2 100%   BMI 24.96 kg/m   Physical Exam  Constitutional: He appears well-developed and well-nourished. He appears distressed (ill appearing ).  HENT:  Head: Normocephalic and atraumatic.  Eyes: Conjunctivae are normal.  Neck: Neck supple.  Cardiovascular: Regular rhythm.  Tachycardia present.   Murmur heard. Pulmonary/Chest: Effort normal. No respiratory distress.  Diminished at bases bilaterally   Abdominal: Soft. He exhibits distension and mass (RUQ area, as well as palpable enlarged firm bladder). There is no tenderness. There is no rebound and no guarding.  Genitourinary:  Genitourinary Comments: No masses or fissures; bright red blood on rectal exam   Musculoskeletal: He exhibits edema.  bilat feet and all toes dusky blue but have intact capillary refill; unable to palpate pulses but able to obtain bilat PT pulses and a right DP pulse with doppler; no DP on left with doppler. Edema L>R feet and ankles   Neurological: He is alert.  Skin: Skin is warm and dry.  Psychiatric: He has a normal mood and affect.  Nursing note and vitals reviewed.    ED Treatments / Results  Labs (all labs ordered are listed, but only abnormal results are displayed) Labs Reviewed  BASIC METABOLIC PANEL - Abnormal; Notable for the following:       Result Value   Sodium 133 (*)    Chloride 100 (*)    CO2 18 (*)    Glucose, Bld 141 (*)    BUN 101 (*)    Creatinine, Ser 3.13 (*)    Calcium 8.4 (*)    GFR calc non Af Amer 16 (*)    GFR calc Af Amer 19 (*)    All other components within normal limits  CBC - Abnormal; Notable for the following:    WBC 16.9 (*)    RBC 4.14 (*)    Hemoglobin  12.1 (*)    HCT 37.2 (*)    RDW 15.9 (*)    Platelets 133 (*)    All other components within normal limits  HEPATIC FUNCTION PANEL - Abnormal; Notable for the following:    Total Protein 6.3 (*)    Albumin 2.6 (*)    AST 150 (*)    ALT 69 (*)    Alkaline Phosphatase 198 (*)    Total Bilirubin 2.6 (*)    Bilirubin, Direct 1.5 (*)    Indirect Bilirubin 1.1 (*)    All other components within normal limits  CBG MONITORING, ED - Abnormal; Notable for the following:    Glucose-Capillary 113 (*)    All other components within normal limits  I-STAT CG4 LACTIC ACID, ED - Abnormal; Notable for the following:    Lactic Acid, Venous 4.08 (*)    All other components within normal limits  POC OCCULT BLOOD, ED - Abnormal; Notable for the following:    Fecal Occult Bld POSITIVE (*)    All other components within normal limits  CULTURE, BLOOD (ROUTINE X 2)  CULTURE, BLOOD (ROUTINE X 2)  URINE CULTURE  URINALYSIS, ROUTINE W REFLEX MICROSCOPIC (NOT AT [facility])  PROTIME-INR  I-STAT TROPOININ, ED  I-STAT CG4 LACTIC ACID, ED    EKG  EKG Interpretation  Date/Time:  Monday January 15 2016 18:34:27 EDT Ventricular Rate:  127 PR Interval:    QRS Duration: 89 QT Interval:  341 QTC Calculation: 496 R Axis:   15 Text Interpretation:  Sinus tachycardia Borderline low voltage, extremity leads Repolarization abnormality, prob rate related Since last tracing rate slower Confirmed by Sabra Heck  MD, BRIAN ([id number]) on 12/29/2015 8:08:20 PM       Radiology Dg Chest Port 1 View  Result Date: 01/08/2016 CLINICAL DATA:  Swollen legs.  Pain. EXAM: PORTABLE CHEST 1 VIEW COMPARISON:  01/13/2016 FINDINGS: Low volume chest. No convincing pneumonia or edema but limited behind the heart. Chronic cardiomegaly. No effusion or pneumothorax. EKG leads create artifact across the chest IMPRESSION: 1. Limited low volume chest without edema or convincing pneumonia. 2. Chronic cardiomegaly. Electronically Signed   By: Monte Fantasia M.D.   On: 01/20/2016 19:07    Procedures Procedures (including critical care time)   EMERGENCY DEPARTMENT [place] FAST EXAM  INDICATIONS:Hypotension, Tachycardia and Unstable vital signs PERFORMED BY: Myself, Noemi Chapel MD IMAGES ARCHIVED?: Yes FINDINGS: All views negative LIMITATIONS:  None INTERPRETATION:  No abdominal free fluid and No pericardial effusion COMMENT:  Bladder markedly full and distended      EMERGENCY DEPARTMENT [place] CARDIAC EXAM "Study: Limited Ultrasound of the heart and pericardium"  INDICATIONS:Hypotension, Tachycardia and Unstable Vital Signs Multiple views of the heart and pericardium are obtained with a multi-frequency probe. PERFORMED [id number], Noemi Chapel MD IMAGES ARCHIVED?: Yes FINDINGS: No pericardial effusion, Hyperdynamic contractility and Tamponade physiology absent LIMITATIONS:  None VIEWS USED: Subcostal 4 chamber, Parasternal long axis, Parasternal short axis and Apical 4 chamber  INTERPRETATION: Cardiac activity present, Pericardial effusioin absent, Cardiac tamponade absent and Increased contractility COMMENT:     EMERGENCY DEPARTMENT [place] ABD/AORTA EXAM Study: Limited Ultrasound of the Abdominal Aorta.  INDICATIONS:Hypotension, Tachycardia, Unstable Vital Signs and Age>55 Indication: Multiple views of the abdominal aorta are obtained from the diaphragmatic hiatus to the aortic bifurcation in transverse and sagittal planes with a multi- Frequency probe. PERFORMED BY: Myself, Noemi Chapel MD IMAGES ARCHIVED?: Yes FINDINGS: Free fluid absent, maximal aortic dimensions < 3.5 cm LIMITATIONS:  Bowel gas INTERPRETATION:  No abdominal aortic aneurysm and Abdominal free fluid absent COMMENT:     CENTRAL LINE Performed by: Ivin Booty Consent: The procedure was performed in an emergent situation. Required items: required blood products, implants, devices, and special equipment available Patient identity confirmed: arm band and  provided demographic data Time out: Immediately prior to procedure a "time out" was called to verify the correct patient, procedure, equipment, support staff and site/side marked as required. Indications: vascular access Anesthesia: local infiltration Local anesthetic: lidocaine 1% with epinephrine Anesthetic total: 3 ml Patient sedated: no Preparation: skin prepped with 2% chlorhexidine Skin prep agent dried: skin prep agent completely dried prior to procedure Sterile barriers: all five maximum sterile barriers used - cap, mask, sterile gown, sterile gloves, and large sterile sheet Hand hygiene: hand hygiene performed prior to central venous catheter insertion  Location details: right IJ  Catheter type: triple lumen Catheter size: 8 Fr Pre-procedure: landmarks  identified Ultrasound guidance: yes Successful placement: yes Post-procedure: line sutured and dressing applied Assessment: blood return through all parts, free fluid flow, placement verified by x-ray and no pneumothorax on x-ray Patient tolerance: Patient tolerated the procedure well with no immediate complications.    Medications Ordered in ED Medications  vancomycin (VANCOCIN) IVPB 1000 mg/200 mL premix (1,000 mg Intravenous New Bag/Given 01/09/2016 2233)  vancomycin (VANCOCIN) IVPB 1000 mg/200 mL premix (not administered)  sodium chloride 0.9 % bolus 1,000 mL (0 mLs Intravenous Stopped 01/01/2016 1905)  sodium chloride 0.9 % bolus 1,000 mL (0 mLs Intravenous Stopped 01/17/2016 2152)  sodium chloride 0.9 % bolus 1,000 mL (0 mLs Intravenous Stopped 01/10/2016 2236)  piperacillin-tazobactam (ZOSYN) IVPB 3.375 g (3.375 g Intravenous New Bag/Given 01/25/2016 2233)  norepinephrine (LEVOPHED) 4 mg in dextrose 5 % 250 mL (0.016 mg/mL) infusion (10 mcg/min Intravenous New Bag/Given 01/14/2016 2259)    Initial Impression / Assessment and Plan / ED Course  I have reviewed the triage vital signs and the nursing notes.  Pertinent labs & imaging  results that were available during my care of the patient were reviewed by me and considered in my medical decision making (see chart for details).  Clinical Course   82 year old male with history of prostate cancer, recent diagnosis of metastatic disease in the liver and peritoneum, paroxysmal SVT, CKD, hypertension, anemia, mitral valve prolapse presenting with bilateral foot pain and discoloration, urinary retention, and bright red blood in the stool, as above. Upon arrival patient is ill appearing. He is afebrile but tachycardic and hypotensive.   Upon initial assessment with bedside ultrasound, patient has no intra-abdominal free fluid, no pericardial effusion or tamponade, hyperdynamic contractility of heart, large distended bladder, and normal caliber aorta.   Patient does have bilateral dopplerable PT pulses in feet and he does have intact cap refill though feet are dusky and discolored. He has a right dopplerable DP but I was unable to obtain a left. It is past hour to obtain duplex studies. Consulted and discussed with Vascular Surgery, who evaluated patient at bedside. No acute intervention recommended at this time. Advised obtaining duplex studies in the morning primarily to assess venous disease. They will continue to follow. In the setting of GI bleeding, no heparin recommended at this time.   Patient's labs notable for multiple organ dysfunction with elevated LFTs and an AKI on CKD, which I suspect is at least partly due to hypotension but also from obstruction in setting of acute urinary retention. After placement of Foley, patient produced nearly 2 L of urine.   His blood pressure initially responded to fluid resuscitation. However, after 3 L of fluid patient became again hypotensive and Levophed was started. Cultures are sent and patient was started on broad-spectrum antibiotic coverage for possible sepsis. Unclear source, urine does not appear infected and chest x-ray is not c/w PNA.     His hemoglobin today is at baseline, and he has had no further episodes of lower GI bleeding in the ED. However, will need to be monitored for ongoing GI bleeding and admitted to the ICU for further treatment of septic shock. I discussed code status with the patient in the department and he would like to be a full code at this time.    Sepsis - Repeat Assessment  Performed at:   After finishing IVF and when initiating pressors   Vitals     Blood pressure 112/77, pulse (!) 124, temperature 98.4 F (36.9 C), temperature source Rectal, resp. rate 24,  weight 68 kg, SpO2 100 %.  Heart:     Tachycardic  Lungs:    CTA  Capillary Refill:   > 2 sec  Peripheral Pulse:   Radial pulse palpable, PT pulses dopplerable   Skin:     Mottled       Final Clinical Impressions(s) / ED Diagnoses   Final diagnoses:  Foot pain, bilateral  Septic shock ([place])  Acute GI bleeding  Urinary retention  AKI (acute kidney injury) [facility])    New Prescriptions New Prescriptions   No medications on file     Ivin Booty, MD 01/16/16 0104    Noemi Chapel, MD 01/16/16 [phone]

## 2016-01-15 NOTE — ED Notes (Signed)
Pt's CBG result was 113. Informed Eric - RN.

## 2016-01-15 NOTE — Progress Notes (Signed)
Pharmacy Antibiotic Note  Shawn Booker is a 82 y.o. male admitted on 01/22/2016 with hypotension and shortness of breath.  Pharmacy has been consulted for vancomycin dosing. Afebrile, lactate 4.08, tachycardic at 120s, and blood pressure soft at 70s/60s.   Zosyn 3.375g IV once in ED    Plan: Vancomycin 1g IV once in ED  Vancomycin 1g IV every 48 hours.  Goal trough 15-20 mcg/mL.  Monitor renal function, culture results, clinical picture, and vancomycin trough as needed.   Weight: 150 lb (68 kg)  Temp (24hrs), Avg:98 F (36.7 C), Min:97.6 F (36.4 C), Max:98.4 F (36.9 C)   Recent Labs Lab 01/07/2016 1815 01/09/2016 2001  WBC 16.9*  --   CREATININE 3.13*  --   LATICACIDVEN  --  4.08*    Estimated Creatinine Clearance: 13.4 mL/min (by C-G formula based on SCr of 3.13 mg/dL (H)).    No Known Allergies  Antimicrobials this admission: 9/18 vanc >>  9/18 zosyn x1 in ED    Dose adjustments this admission: N/A  Microbiology results: pending   Thank you for allowing pharmacy to be a part of this patient's care.  Argie Ramming, PharmD Pharmacy Resident  Pager [phone] 01/03/2016 10:13 PM

## 2016-01-15 NOTE — ED Notes (Signed)
MD at bedside. 

## 2016-01-15 NOTE — ED Triage Notes (Signed)
Pt has been having sore/swollen bilateral legs. Pt states they are discolored and have no blood flow. Pt has been feeling SOB and is hypotensive. Pt was in at MD office today and sent here due to these issues. BP at MD office 85/64. Pt denies lightheadedness. Denies CP

## 2016-01-15 NOTE — Consult Note (Signed)
Vascular and Vein Specialist of [facility]  Patient name: Shawn Booker MRN: [id number] DOB: Jan 29, 1923 Sex: male  REASON FOR CONSULT: Cool and cyanotic feet bilaterally  HPI: Shawn Booker is a 82 y.o. male, who is seen today in the emergency room. He is very pleasant 82 year old gentleman. He has a complex history including a recent new diagnoses. He had presented to the emergency department on 01/03/2016 with new onset of abdominal pain. Workup at that time revealed extensive diffuse metastases to his liver. Does have a known past history of bladder and prostate cancer cancer but the metastatic disease is a new diagnosis. He does live independently. Has a history of paroxysmal supraventricular tachycardia and mitral valve prolapse. Is not on anticoagulation therapy. He continued to have a discoloration of his lower extremities and was sent to the emergency room by his primary care physician tonight. He reports discomfort in his abdomen. Also reports some numbness and discomfort in his feet. He has been having blood in the stool and also has had the intended abdominal discomfort. He has worsening generalized weakness in his more difficult for him to care for himself.    Past Medical History:  Diagnosis Date  . Anemia   . Bladder cancer ([place])   . CKD (chronic kidney disease)   . H/O osteopenia   . HTN (hypertension)   . MVP (mitral valve prolapse)   . Paroxysmal supraventricular tachycardia ([place])   . Prostate cancer [facility])     Family History  Problem Relation Age of Onset  . Heart disease Mother     SOCIAL HISTORY: Social History   Social History  . Marital status: Divorced    Spouse name: N/A  . Number of children: N/A  . Years of education: N/A   Occupational History  . Not on file.   Social History Main Topics  . Smoking status: Never Smoker  . Smokeless tobacco: Never Used  . Alcohol use No  . Drug use: No  . Sexual activity:  Not on file   Other Topics Concern  . Not on file   Social History Narrative  . No narrative on file    No Known Allergies  No current facility-administered medications for this encounter.    Current Outpatient Prescriptions  Medication Sig Dispense Refill  . Bioflavonoid Products (ESTER-C) 500-200-60 MG TABS Take 2 tablets by mouth daily.    . furosemide (LASIX) 80 MG tablet Take 80 mg by mouth 2 (two) times daily.     Marland Kitchen OVER THE COUNTER MEDICATION Take 1 tablet by mouth daily.    Marland Kitchen Ubiquinol 100 MG CAPS Take 100 mg by mouth daily.    Marland Kitchen HYDROcodone-acetaminophen (NORCO/VICODIN) 5-325 MG tablet Take 1 tablet by mouth every 6 (six) hours as needed. (Patient not taking: Reported on 01/19/2016) 15 tablet 0    REVIEW OF SYSTEMS:  Reviewed in his history and physical with nothing further to add    PHYSICAL EXAM: Vitals:   01/03/2016 1915 01/23/2016 1945 01/12/2016 1946 01/26/2016 2100  BP: 119/94 102/83  90/75  Pulse: (!) 125   (!) 125  Resp: (!) 27 (!) 28  23  Temp:   98.4 F (36.9 C)   TempSrc:   Rectal   SpO2:    97%  Weight:        GENERAL: The patient is aThin  male, in no acute distress. The vital signs are documented above. VASCULAR: 2+ radial pulses 2+ femoral and 2-3+ popliteal pulses bilaterally.  Absent pedal pulses. He does have a dampened but present anterior tibial and posterior tibial at the ankle bilaterally  PULMONARY: There is good air exchange  ABDOMEN: Soft and non-tenderwith no palpable masses  MUSCULOSKELETAL: There are no major deformities  NEUROLOGIC:He does have the ability to move his toes bilaterally is diminished sensation in both feet  SKIN: There are no ulcers or rashes noted. PSYCHIATRIC: The patient has a normal affect.  DATA:  No new imaging. Did review his films from 01/03/2016 with extensive hepatic metastasis.  MEDICAL ISSUES: No evidence of acute ischemia in his lower extremities with a normal popliteal pulses and audible flow in his feet.  Suspect the cyanosis is related to long-standing venous hypertension as evidenced by varicosities bilaterally. He could possibly have DVT associated with his metastatic disease. I discussed this at length with the patient and also his daughter present. Explained there are no acute vascular issues. Did explain the critical nature of his extensive multisystem dysfunction. We'll follow with you but nothing to add from acute vascular standpoint  Rosetta Posner, MD [facility] Vascular and Vein Specialists of [facility] Tel [phone] Pager [phone]

## 2016-01-15 NOTE — ED Triage Notes (Signed)
EKG shows Aflutter.

## 2016-01-16 ENCOUNTER — Emergency Department (HOSPITAL_COMMUNITY): Payer: Medicare Other

## 2016-01-16 ENCOUNTER — Inpatient Hospital Stay (HOSPITAL_COMMUNITY): Payer: Medicare Other

## 2016-01-16 ENCOUNTER — Encounter (HOSPITAL_COMMUNITY): Payer: Self-pay | Admitting: Radiology

## 2016-01-16 DIAGNOSIS — M79672 Pain in left foot: Secondary | ICD-10-CM

## 2016-01-16 DIAGNOSIS — D696 Thrombocytopenia, unspecified: Secondary | ICD-10-CM | POA: Diagnosis present

## 2016-01-16 DIAGNOSIS — I82411 Acute embolism and thrombosis of right femoral vein: Secondary | ICD-10-CM | POA: Diagnosis present

## 2016-01-16 DIAGNOSIS — R6521 Severe sepsis with septic shock: Secondary | ICD-10-CM

## 2016-01-16 DIAGNOSIS — I82433 Acute embolism and thrombosis of popliteal vein, bilateral: Secondary | ICD-10-CM | POA: Diagnosis present

## 2016-01-16 DIAGNOSIS — R791 Abnormal coagulation profile: Secondary | ICD-10-CM | POA: Diagnosis present

## 2016-01-16 DIAGNOSIS — A419 Sepsis, unspecified organism: Secondary | ICD-10-CM | POA: Insufficient documentation

## 2016-01-16 DIAGNOSIS — G9340 Encephalopathy, unspecified: Secondary | ICD-10-CM

## 2016-01-16 DIAGNOSIS — C786 Secondary malignant neoplasm of retroperitoneum and peritoneum: Secondary | ICD-10-CM | POA: Diagnosis present

## 2016-01-16 DIAGNOSIS — E43 Unspecified severe protein-calorie malnutrition: Secondary | ICD-10-CM | POA: Diagnosis present

## 2016-01-16 DIAGNOSIS — K922 Gastrointestinal hemorrhage, unspecified: Secondary | ICD-10-CM | POA: Diagnosis not present

## 2016-01-16 DIAGNOSIS — Z8546 Personal history of malignant neoplasm of prostate: Secondary | ICD-10-CM | POA: Diagnosis not present

## 2016-01-16 DIAGNOSIS — D62 Acute posthemorrhagic anemia: Secondary | ICD-10-CM | POA: Diagnosis present

## 2016-01-16 DIAGNOSIS — K921 Melena: Secondary | ICD-10-CM | POA: Diagnosis present

## 2016-01-16 DIAGNOSIS — Z79899 Other long term (current) drug therapy: Secondary | ICD-10-CM | POA: Diagnosis not present

## 2016-01-16 DIAGNOSIS — C787 Secondary malignant neoplasm of liver and intrahepatic bile duct: Secondary | ICD-10-CM | POA: Diagnosis present

## 2016-01-16 DIAGNOSIS — C801 Malignant (primary) neoplasm, unspecified: Secondary | ICD-10-CM | POA: Diagnosis not present

## 2016-01-16 DIAGNOSIS — Z6824 Body mass index (BMI) 24.0-24.9, adult: Secondary | ICD-10-CM | POA: Diagnosis not present

## 2016-01-16 DIAGNOSIS — R571 Hypovolemic shock: Secondary | ICD-10-CM | POA: Diagnosis not present

## 2016-01-16 DIAGNOSIS — I82443 Acute embolism and thrombosis of tibial vein, bilateral: Secondary | ICD-10-CM | POA: Diagnosis present

## 2016-01-16 DIAGNOSIS — I129 Hypertensive chronic kidney disease with stage 1 through stage 4 chronic kidney disease, or unspecified chronic kidney disease: Secondary | ICD-10-CM | POA: Diagnosis present

## 2016-01-16 DIAGNOSIS — N183 Chronic kidney disease, stage 3 (moderate): Secondary | ICD-10-CM | POA: Diagnosis present

## 2016-01-16 DIAGNOSIS — I82403 Acute embolism and thrombosis of unspecified deep veins of lower extremity, bilateral: Secondary | ICD-10-CM | POA: Diagnosis not present

## 2016-01-16 DIAGNOSIS — M7989 Other specified soft tissue disorders: Secondary | ICD-10-CM | POA: Diagnosis not present

## 2016-01-16 DIAGNOSIS — M79671 Pain in right foot: Secondary | ICD-10-CM | POA: Insufficient documentation

## 2016-01-16 DIAGNOSIS — N170 Acute kidney failure with tubular necrosis: Secondary | ICD-10-CM | POA: Diagnosis present

## 2016-01-16 DIAGNOSIS — J9601 Acute respiratory failure with hypoxia: Secondary | ICD-10-CM | POA: Diagnosis not present

## 2016-01-16 DIAGNOSIS — Z8551 Personal history of malignant neoplasm of bladder: Secondary | ICD-10-CM | POA: Diagnosis not present

## 2016-01-16 DIAGNOSIS — E872 Acidosis: Secondary | ICD-10-CM | POA: Diagnosis present

## 2016-01-16 DIAGNOSIS — N179 Acute kidney failure, unspecified: Secondary | ICD-10-CM | POA: Diagnosis present

## 2016-01-16 DIAGNOSIS — K625 Hemorrhage of anus and rectum: Secondary | ICD-10-CM | POA: Diagnosis not present

## 2016-01-16 DIAGNOSIS — I341 Nonrheumatic mitral (valve) prolapse: Secondary | ICD-10-CM | POA: Diagnosis present

## 2016-01-16 DIAGNOSIS — D649 Anemia, unspecified: Secondary | ICD-10-CM | POA: Diagnosis present

## 2016-01-16 DIAGNOSIS — R339 Retention of urine, unspecified: Secondary | ICD-10-CM | POA: Diagnosis present

## 2016-01-16 HISTORY — PX: IR GENERIC HISTORICAL: IMG1180011

## 2016-01-16 LAB — URINE CULTURE: Culture: <10000 — AB

## 2016-01-16 LAB — PROTIME-INR
INR: 2.1
PROTHROMBIN TIME: 23.9 s — AB (ref 11.4–15.2)

## 2016-01-16 LAB — BASIC METABOLIC PANEL
Anion gap: 12 (ref 5–15)
BUN: 91 mg/dL — AB (ref 6–20)
CHLORIDE: 106 mmol/L (ref 101–111)
CO2: 19 mmol/L — ABNORMAL LOW (ref 22–32)
CREATININE: 2.6 mg/dL — AB (ref 0.61–1.24)
Calcium: 7.4 mg/dL — ABNORMAL LOW (ref 8.9–10.3)
GFR calc Af Amer: 23 mL/min — ABNORMAL LOW (ref >60)
GFR calc non Af Amer: 20 mL/min — ABNORMAL LOW (ref >60)
GLUCOSE: 141 mg/dL — AB (ref 65–99)
POTASSIUM: 4.1 mmol/L (ref 3.5–5.1)
SODIUM: 137 mmol/L (ref 135–145)

## 2016-01-16 LAB — CBC
HCT: 29.7 % — ABNORMAL LOW (ref 39.0–52.0)
HEMOGLOBIN: 9.6 g/dL — AB (ref 13.0–17.0)
MCH: 29.1 pg (ref 26.0–34.0)
MCHC: 32.3 g/dL (ref 30.0–36.0)
MCV: 90 fL (ref 78.0–100.0)
PLATELETS: 121 10*3/uL — AB (ref 150–400)
RBC: 3.3 MIL/uL — AB (ref 4.22–5.81)
RDW: 16.5 % — ABNORMAL HIGH (ref 11.5–15.5)
WBC: 15.7 10*3/uL — ABNORMAL HIGH (ref 4.0–10.5)

## 2016-01-16 LAB — TROPONIN I: Troponin I: 0.07 ng/mL (ref <0.03)

## 2016-01-16 LAB — I-STAT CG4 LACTIC ACID, ED: LACTIC ACID, VENOUS: 1.79 mmol/L (ref 0.5–1.9)

## 2016-01-16 LAB — PREALBUMIN: PREALBUMIN: 3.2 mg/dL — AB (ref 18–38)

## 2016-01-16 LAB — MAGNESIUM: Magnesium: 2.3 mg/dL (ref 1.7–2.4)

## 2016-01-16 LAB — PHOSPHORUS: Phosphorus: 4.1 mg/dL (ref 2.5–4.6)

## 2016-01-16 LAB — PROCALCITONIN: Procalcitonin: 6.71 ng/mL

## 2016-01-16 LAB — MRSA PCR SCREENING: MRSA BY PCR: NEGATIVE

## 2016-01-16 MED ORDER — DEXMEDETOMIDINE HCL IN NACL 200 MCG/50ML IV SOLN
0.4000 ug/kg/h | INTRAVENOUS | Status: DC
  Filled 2016-01-16: qty 50

## 2016-01-16 MED ORDER — MIDAZOLAM HCL 2 MG/2ML IJ SOLN
INTRAMUSCULAR | Status: AC
  Filled 2016-01-16: qty 2

## 2016-01-16 MED ORDER — PIPERACILLIN-TAZOBACTAM IN DEX 2-0.25 GM/50ML IV SOLN
2.2500 g | Freq: Three times a day (TID) | INTRAVENOUS | Status: DC
  Administered 2016-01-16 – 2016-01-18 (×8): 2.25 g via INTRAVENOUS
  Filled 2016-01-16 (×10): qty 50

## 2016-01-16 MED ORDER — NOREPINEPHRINE BITARTRATE 1 MG/ML IV SOLN
2.0000 ug/min | INTRAVENOUS | Status: DC
  Administered 2016-01-16: 16 ug/min via INTRAVENOUS
  Administered 2016-01-16: 2 ug/min via INTRAVENOUS
  Filled 2016-01-16 (×3): qty 4

## 2016-01-16 MED ORDER — LIDOCAINE HCL 1 % IJ SOLN
INTRAMUSCULAR | Status: AC | PRN
  Administered 2016-01-16: 20 mL

## 2016-01-16 MED ORDER — SODIUM CHLORIDE 0.9 % IV BOLUS (SEPSIS)
1000.0000 mL | Freq: Once | INTRAVENOUS | Status: AC
  Administered 2016-01-16: 1000 mL via INTRAVENOUS

## 2016-01-16 MED ORDER — MIDAZOLAM HCL 2 MG/2ML IJ SOLN
INTRAMUSCULAR | Status: AC | PRN
  Administered 2016-01-16: 0.5 mg via INTRAVENOUS

## 2016-01-16 MED ORDER — PANTOPRAZOLE SODIUM 40 MG IV SOLR
40.0000 mg | Freq: Two times a day (BID) | INTRAVENOUS | Status: DC
  Administered 2016-01-16 – 2016-01-18 (×5): 40 mg via INTRAVENOUS
  Filled 2016-01-16 (×6): qty 40

## 2016-01-16 MED ORDER — SODIUM CHLORIDE 0.9 % IV SOLN: 250.0000 mL | INTRAVENOUS | Status: DC | PRN

## 2016-01-16 MED ORDER — DEXTROSE 5 % IV SOLN
5.0000 mg | Freq: Once | INTRAVENOUS | Status: AC
  Administered 2016-01-16: 5 mg via INTRAVENOUS
  Filled 2016-01-16: qty 0.5

## 2016-01-16 MED ORDER — LIDOCAINE HCL 1 % IJ SOLN
INTRAMUSCULAR | Status: AC
Start: 2016-01-16 — End: 2016-01-17
  Filled 2016-01-16: qty 20

## 2016-01-16 MED ORDER — SODIUM CHLORIDE 0.9 % IV BOLUS (SEPSIS)
500.0000 mL | Freq: Once | INTRAVENOUS | Status: AC
  Administered 2016-01-16: 500 mL via INTRAVENOUS

## 2016-01-16 MED ORDER — SODIUM CHLORIDE 0.9 % IV SOLN
INTRAVENOUS | Status: DC
  Administered 2016-01-18: 03:00:00 via INTRAVENOUS

## 2016-01-16 MED ORDER — DILTIAZEM HCL 100 MG IV SOLR: 5.0000 mg/h | INTRAVENOUS | Status: DC

## 2016-01-16 NOTE — ED Notes (Signed)
Critical care at bedside  

## 2016-01-16 NOTE — Sedation Documentation (Addendum)
Titrated Levo, from 41mcg, up to 70mcg, treating current  BP of 72/54

## 2016-01-16 NOTE — Care Management Note (Addendum)
Case Management Note  Patient Details  Name: Shawn Booker MRN: [id number] Date of Birth: [birth year]  Subjective/Objective:    Pt admitted with leg swelling, hypotension and change in mental status, new bilateral DVT.  Pt has hx of cancer that has spread to liver and peritoneum.          Action/Plan:  Pt alert and oriented - states he lives independently with adult daughter, has very supportive neighbors.  Pt stated that he feels deconditioned.  Pt is scheduled for IVC filter.  Pt will need PT/OT eval when medically stable - CM will request attending to determine when evaluation is appropriate and order eval.  CM will continue to follow for discharge needs Expected Discharge Date:                  Expected Discharge Plan:  [place]  In-House Referral:     Discharge planning Services  CM Consult  Post Acute Care Choice:    Choice offered to:     DME Arranged:    DME Agency:     HH Arranged:    [place] Agency:     Status of Service:  In process, will continue to follow  If discussed at Long Length of Stay Meetings, dates discussed:    Additional Comments:  Maryclare Labrador, RN 01/16/2016, 11:38 AM

## 2016-01-16 NOTE — Sedation Documentation (Signed)
Patient is resting comfortably. 

## 2016-01-16 NOTE — Progress Notes (Signed)
[facility] Progress Note Patient Name: Shawn Booker DOB: 15-Jun-1922 MRN: [id number]   Date of Service  01/16/2016  HPI/Events of Note  Request for Diet. Patient NPO for IVC filter. Now s/p procedure and awake, alert and follows commands.   eICU Interventions  Will order: 1. Regular diet as tolerated.         [name] 01/16/2016, 5:47 PM

## 2016-01-16 NOTE — Progress Notes (Signed)
[facility] PCCM  DVT bilaterally Bleeding, so cannot use blood thinners  Consult IR for IVC filter  Roselie Awkward, MD [place] PCCM Pager: [phone] Cell: [phone] After 3pm or if no response, call [phone]

## 2016-01-16 NOTE — H&P (Signed)
PULMONARY / CRITICAL CARE MEDICINE   Name: Shawn Booker MRN: [id number] DOB: 12/11/1922    ADMISSION DATE:  01/22/2016 CONSULTATION DATE:  01/16/16  REFERRING MD:  Sabra Heck - EDP  CHIEF COMPLAINT:  Cold feet  HISTORY OF PRESENT ILLNESS:  Pt is encephelopathic; therefore, this HPI is obtained from chart review. Shawn Booker is a 82 y.o. male with PMH as outlined below including hx prostate and bladder CA with recently found metastatic disease to the liver and peritoneum (presented to ED 01/03/16 with abd pain and had CT scan at that time).  He was to follow up with Dr. Jeffie Pollock his oncologist.  On 9/19, he saw PCP for bilateral foot pain.  Daughter felt that there may be vascular compromise so took him in.  At PCP, he had hypotension and was somnolent so was sent to ED for further evaluation.  He was seen by vascular surgery while in ED and there was no indication for emergent surgical intervention.  Due to ongoing hypotension despite 74ml/kg bolus, he was started on pressors and PCCM was subsequently asked to admit.  PAST MEDICAL HISTORY :  He  has a past medical history of Anemia; Bladder cancer ([place]); CKD (chronic kidney disease); H/O osteopenia; HTN (hypertension); MVP (mitral valve prolapse); Paroxysmal supraventricular tachycardia ([place]); and Prostate cancer ([place]).  PAST SURGICAL HISTORY: He  has a past surgical history that includes Hernia repair; Bladder Tumor, Cysto with fulguration; and Transurethral resection of prostate.  No Known Allergies  No current facility-administered medications on file prior to encounter.    Current Outpatient Prescriptions on File Prior to Encounter  Medication Sig  . furosemide (LASIX) 80 MG tablet Take 80 mg by mouth 2 (two) times daily.   Marland Kitchen HYDROcodone-acetaminophen (NORCO/VICODIN) 5-325 MG tablet Take 1 tablet by mouth every 6 (six) hours as needed. (Patient not taking: Reported on 01/26/2016)    FAMILY HISTORY:  His indicated that his mother  is deceased. He indicated that his father is deceased.    SOCIAL HISTORY: He  reports that he has never smoked. He has never used smokeless tobacco. He reports that he does not drink alcohol or use drugs.  REVIEW OF SYSTEMS:   Unable to obtain as pt is encephalopathic.  SUBJECTIVE:  Opens eyes and mumbles words.  VITAL SIGNS: BP 101/80   Pulse (!) 131   Temp 98.4 F (36.9 C) (Rectal)   Resp 26   Wt 150 lb (68 kg)   SpO2 97%   BMI 24.96 kg/m   HEMODYNAMICS:    VENTILATOR SETTINGS:    INTAKE / OUTPUT: No intake/output data recorded.   PHYSICAL EXAMINATION: General: Elderly male, in NAD. Neuro: Opens eyes to voice and mumbles words.  Does answer basic questions appropriately after asking several times. HEENT: [place]/AT. PERRL, sclerae anicteric. Cardiovascular: Tachy, regular, no M/R/G.  Lungs: Respirations even and unlabored.  CTA bilaterally, No W/R/R.  + PT pulses bilaterally. Abdomen: + BS.  + hepatomegaly with tenderness in epigastrium. Musculoskeletal: No gross deformities, no edema.  Skin: Intact, warm, no rashes.  LABS:  BMET  Recent Labs Lab 12/31/2015 1815  NA 133*  K 5.0  CL 100*  CO2 18*  BUN 101*  CREATININE 3.13*  GLUCOSE 141*    Electrolytes  Recent Labs Lab 01/14/2016 1815  CALCIUM 8.4*    CBC  Recent Labs Lab 01/05/2016 1815  WBC 16.9*  HGB 12.1*  HCT 37.2*  PLT 133*    Coag's No results for input(s): APTT, INR  in the last 168 hours.  Sepsis Markers  Recent Labs Lab 01/24/2016 2001 01/16/16 0150  LATICACIDVEN 4.08* 1.79    ABG No results for input(s): PHART, PCO2ART, PO2ART in the last 168 hours.  Liver Enzymes  Recent Labs Lab 01/25/2016 1959  AST 150*  ALT 69*  ALKPHOS 198*  BILITOT 2.6*  ALBUMIN 2.6*    Cardiac Enzymes No results for input(s): TROPONINI, PROBNP in the last 168 hours.  Glucose  Recent Labs Lab 01/21/2016 1825  GLUCAP 113*    Imaging Dg Chest Portable 1 View  Result Date:  01/16/2016 CLINICAL DATA:  Line placement.  Shortness of breath. EXAM: PORTABLE CHEST 1 VIEW COMPARISON:  01/24/2016 FINDINGS: Right central venous catheter placed with tip over the cavoatrial junction. No visible pneumothorax. Shallow inspiration with atelectasis in the lung bases. No blunting of costophrenic angles. Calcified and tortuous aorta. Cardiac enlargement without vascular congestion. Old right rib fractures. IMPRESSION: Right central venous catheter with tip over the cavoatrial junction. No pneumothorax. Shallow inspiration. Cardiac enlargement. Electronically Signed   By: Lucienne Capers M.D.   On: 01/16/2016 01:09   Dg Chest Port 1 View  Result Date: 12/31/2015 CLINICAL DATA:  Swollen legs.  Pain. EXAM: PORTABLE CHEST 1 VIEW COMPARISON:  01/13/2016 FINDINGS: Low volume chest. No convincing pneumonia or edema but limited behind the heart. Chronic cardiomegaly. No effusion or pneumothorax. EKG leads create artifact across the chest IMPRESSION: 1. Limited low volume chest without edema or convincing pneumonia. 2. Chronic cardiomegaly. Electronically Signed   By: Monte Fantasia M.D.   On: 01/22/2016 19:07     STUDIES:  CXR 9/19 > no acute process. Venous duplex 9/19 >  CULTURES: Blood 9/19 > Urine 9/19 > Sputum 9/19 >  ANTIBIOTICS: Vanc 9/19 >  Zosyn 9/19 >  SIGNIFICANT EVENTS: 9/19 > admit.  LINES/TUBES: R IJ CVL 9/19 >  DISCUSSION: 82 y.o. male with hx prostate and bladder CA now with recently found metastatic disease to liver and peritoneum, admitted 9/19 with shock.  Suspect combo of septic + hypovolemic.  ASSESSMENT / PLAN:  CARDIOVASCULAR A:  Shock - suspect combo of septic (unclear etiology) + hypovolemic.  Lactate clearance reassuring. Possible PVD. Hx PSVT, HTN, MVP.  Sepsis - Repeat Assessment  Performed at:    0200  Vitals     Blood pressure 101/80, pulse (!) 131, temperature 98.4 F (36.9 C), temperature source Rectal, resp. rate 26, weight 150 lb  (68 kg), SpO2 97 %.  Heart:     Tachycardic  Lungs:    CTA  Capillary Refill:   <2 sec  Peripheral Pulse:   Radial pulse palpable  Skin:     Normal Color  P:  Additional 1L NS bolus now. Continue levophed as needed to maintain goal MAP > 65. Assess CVP's. Assess lactate. F/u on venous duplex studies.  INFECTIOUS A:   Shock - suspect combo of septic (unclear etiology) + hypovolemic.  Lactate clearance reassuring. P:   Abx as above (vanc / zosyn).  Follow cultures as above. PCT algorithm to limit abx exposure.  HEMATOLOGIC / ONCOLOGIC  A:   Hx prostate and bladder CA with newly found mets to liver and peritoneum (as seen on CT 01/03/16). Possible DVT. Thrombocytopenia. VTE Prophylaxis. P:  F/u with oncology as outpatient. Needs further goals of care discussions. Heparin SQ. F/u venous duplex studies. Monitor platelet counts. CBC in AM.  PULMONARY A: No acute issues. P:   No interventions required.  RENAL A:   CKD.  Pseudohypocalcemia - corrects to 9.52. P:   NS @ 100. Assess ionized calcium. BMP in AM.  GASTROINTESTINAL A:   Nutrition. P:   NPO.  ENDOCRINE A:   No acute issues. P:   No interventions required.  NEUROLOGIC A:   No acute issues. P:   Avoid sedating meds.  Family updated: None available.  Discussed goals of care briefly with patient and he would like to continue full code measures for now.  Will definitely need further discussions with family involvement regarding how to move forward given new metastatic disease.  Interdisciplinary Family Meeting v Palliative Care Meeting:  Due by: 9/26.  CC time: 35 minutes.  Montey Hora, [place] [facility] Pager: [phone]  or [phone] 01/16/2016, 2:03 AM  Attending Note:  82 year old male with PMH prostate and bladder cancer with wide spread metastasis who came in on the 6th with abdominal pain and CT showed wide mets to liver and peritoneum.   Patient here from PCP's office with concern for a cold foot that vascular evaluated the patient for.  Here with sepsis unknown origin and on levophed.  On physical exam, patient has mild tachypnea but lungs are clear.  I reviewed CXR myself without significant findings.  Patient is on levophed.  Discussion with the patient, he wishes for full code status.  Evidently the cancer diagnosis is relatively new.  May need involvement of palliative care.  Vanc/zosyn ordered.  F/U on cultures.  Continue pressors.  F/u CVP.  Will need further discussion regarding plan of care.  The patient is critically ill with multiple organ systems failure and requires high complexity decision making for assessment and support, frequent evaluation and titration of therapies, application of advanced monitoring technologies and extensive interpretation of multiple databases.   Critical Care Time devoted to patient care services described in this note is  35  Minutes. This time reflects time of care of this signee Dr Jennet Maduro. This critical care time does not reflect procedure time, or teaching time or supervisory time of PA/NP/Med student/Med Resident etc but could involve care discussion time.  Rush Farmer, M.D. [facility] Pulmonary/Critical Care Medicine. Pager: [phone]. After hours pager: [phone].

## 2016-01-16 NOTE — Progress Notes (Addendum)
Preliminary results by tech - Venous Duplex Lower Ext. Completed. Positive for acute deep vein thrombosis involving the right distal femoral vein, bilateral popliteal veins and bilateral posterior tibial and peroneal veins.  Arterial Duplex test was not completed.  Documented triphasic normal waveforms were noted at the ankles bilaterally. Results given to patient's nurse, Carmell Austria.   Oda Cogan, BS, RDMS, RVT

## 2016-01-16 NOTE — Consult Note (Signed)
Chief Complaint: Patient was seen in consultation today for inferior vena cava filter placement Chief Complaint  Patient presents with  . Shortness of Breath  . Leg Swelling   at the request of Dr Simonne Maffucci  Referring Physician(s): Dr Simonne Maffucci  Supervising Physician: Marybelle Killings  Patient Status: Inpatient  History of Present Illness: Shawn Booker is a 82 y.o. male   Admitted to ICU for sepsis: source unknown Known bladder and prostate cancer Liver and peritoneum metastasis New Bilateral DVT Preliminary results by tech - Venous Duplex Lower Ext. Completed. Positive for acute deep vein thrombosis involving the right distal femoral vein, bilateral popliteal veins and bilateral posterior tibial and peroneal veins  Bloody bowel movement this am  Dr Lake Bells requesting retrievable IVC filter placement Cannot anticoagulate Discussed with Dr Josue Hector procedure  INR 2.1 Cr 2.6  Past Medical History:  Diagnosis Date  . Anemia   . Bladder cancer ([place])   . CKD (chronic kidney disease)   . H/O osteopenia   . HTN (hypertension)   . MVP (mitral valve prolapse)   . Paroxysmal supraventricular tachycardia ([place])   . Prostate cancer [facility])     Past Surgical History:  Procedure Laterality Date  . Bladder Tumor, Cysto with fulguration    . HERNIA REPAIR    . TRANSURETHRAL RESECTION OF PROSTATE      Allergies: Review of patient's allergies indicates no known allergies.  Medications: Prior to Admission medications   Medication Sig Start Date End Date Taking? Authorizing Provider  Bioflavonoid Products (ESTER-C) 500-200-60 MG TABS Take 2 tablets by mouth daily.   Yes Historical Provider, MD  furosemide (LASIX) 80 MG tablet Take 80 mg by mouth 2 (two) times daily.    Yes Historical Provider, MD  OVER THE COUNTER MEDICATION Take 1 tablet by mouth daily.   Yes Historical Provider, MD  Ubiquinol 100 MG CAPS Take 100 mg by mouth daily.   Yes Historical  Provider, MD  HYDROcodone-acetaminophen (NORCO/VICODIN) 5-325 MG tablet Take 1 tablet by mouth every 6 (six) hours as needed. Patient not taking: Reported on 12/31/2015 01/04/16   Merryl Hacker, MD     Family History  Problem Relation Age of Onset  . Heart disease Mother     Social History   Social History  . Marital status: Divorced    Spouse name: N/A  . Number of children: N/A  . Years of education: N/A   Social History Main Topics  . Smoking status: Never Smoker  . Smokeless tobacco: Never Used  . Alcohol use No  . Drug use: No  . Sexual activity: Not Asked   Other Topics Concern  . None   Social History Narrative  . None     Review of Systems: A 12 point ROS discussed and pertinent positives are indicated in the HPI above.  All other systems are negative.  Review of Systems  Constitutional: Positive for activity change, appetite change and fatigue. Negative for fever.  Cardiovascular: Positive for leg swelling. Negative for chest pain.  Neurological: Positive for weakness.  Psychiatric/Behavioral: Negative for behavioral problems and confusion.    Vital Signs: BP 92/73   Pulse (!) 131   Temp 97.8 F (36.6 C) (Axillary)   Resp (!) 21   Wt 156 lb 1.4 oz (70.8 kg)   SpO2 100%   BMI 25.97 kg/m   Physical Exam  Constitutional: He is oriented to person, place, and time.  Cardiovascular: Normal rate and regular  rhythm.   Pulmonary/Chest: Effort normal and breath sounds normal.  Abdominal: Soft. Bowel sounds are normal.  Musculoskeletal: Normal range of motion.  Neurological: He is alert and oriented to person, place, and time.  Skin: Skin is warm and dry.  Psychiatric: He has a normal mood and affect. His behavior is normal. Judgment and thought content normal.  Nursing note and vitals reviewed.   Mallampati Score:  MD Evaluation Airway: WNL Heart: WNL Abdomen: WNL Chest/ Lungs: WNL ASA  Classification: 3 Mallampati/Airway Score:  Two  Imaging: Ct Abdomen Pelvis Wo Contrast  Result Date: 01/04/2016 CLINICAL DATA:  Abdominal pain.  No diarrhea or vomiting EXAM: CT ABDOMEN AND PELVIS WITHOUT CONTRAST TECHNIQUE: Multidetector CT imaging of the abdomen and pelvis was performed following the standard protocol without IV contrast. COMPARISON:  CT abdomen pelvis 11/15/2013, CT pelvis 05/22/2015 FINDINGS: Lower chest: Focal area of consolidation in the posterior right lower lobe. No pleural effusion. Heart is enlarged. Hepatobiliary: There are numerous hypo attenuating masses throughout the liver, which are new compared to the CT of 05/22/2015. The largest is located in the left hepatic lobe measures up to 7.3 cm in greatest transverse dimension. The gallbladder is nondistended. There is small volume perihepatic ascites. Pancreas: There are multiple calcifications of the atrophic pancreas. Spleen: Normal. Adrenals/Urinary Tract: Normal adrenal glands. There are multiple bilateral renal cysts. There is left-greater-than-right advanced cortical renal atrophy. Stomach/Bowel: No abnormal bowel dilatation. No bowel wall thickening or adjacent fat stranding to indicate acute inflammation. No abdominal fluid collection. Vascular/Lymphatic: Mild aortic atherosclerotic calcification. No abdominal aortic aneurysm. Multiple subcentimeter retroperitoneal lymph nodes. Reproductive: Size of the prostate is normal. Seminal vesicles are unremarkable. Small amount of fluid within the left pelvis Musculoskeletal: Multilevel severe facet arthrosis and osteophytosis. No advanced bony canal stenosis. Bilateral sacroiliac bridging osteophytes. No lytic or blastic osseous lesions. There is a soft tissue attenuation focus within the anterior abdominal wall (series 2, image 35) measuring 16 x 8 mm. Other: No contributory non-categorized findings. IMPRESSION: 1. Diffuse hepatic metastatic disease, new from the prior examination. Small volume perihepatic ascites. 2. Soft  tissue nodule within the subcutaneous fat of the anterior abdominal wall is also concerning for metastatic disease, though the appearance is somewhat nonspecific. 3. Focal area of consolidation in the right lower lobe. This could indicate developing infection. Electronically Signed   By: Ulyses Jarred M.D.   On: 01/04/2016 03:11   Dg Chest 2 View  Result Date: 01/03/2016 CLINICAL DATA:  82 year old male with shortness of breath EXAM: CHEST  2 VIEW COMPARISON:  Chest radiograph dated 05/24/2008 FINDINGS: There is mild cardiomegaly with mild central vascular congestion. No pulmonary edema. Mild eventration of the left hemidiaphragm with minimal left lung base atelectatic changes. There is no focal consolidation, pleural effusion, or pneumothorax. No acute osseous pathology identified. IMPRESSION: Mild cardiomegaly with mild vascular congestion. No focal consolidation or edema. Electronically Signed   By: Anner Crete M.D.   On: 01/03/2016 20:25   Dg Chest Portable 1 View  Result Date: 01/16/2016 CLINICAL DATA:  Line placement.  Shortness of breath. EXAM: PORTABLE CHEST 1 VIEW COMPARISON:  12/29/2015 FINDINGS: Right central venous catheter placed with tip over the cavoatrial junction. No visible pneumothorax. Shallow inspiration with atelectasis in the lung bases. No blunting of costophrenic angles. Calcified and tortuous aorta. Cardiac enlargement without vascular congestion. Old right rib fractures. IMPRESSION: Right central venous catheter with tip over the cavoatrial junction. No pneumothorax. Shallow inspiration. Cardiac enlargement. Electronically Signed   By: Gwyndolyn Saxon  Gerilyn Nestle M.D.   On: 01/16/2016 01:09   Dg Chest Port 1 View  Result Date: 01/13/2016 CLINICAL DATA:  Swollen legs.  Pain. EXAM: PORTABLE CHEST 1 VIEW COMPARISON:  01/13/2016 FINDINGS: Low volume chest. No convincing pneumonia or edema but limited behind the heart. Chronic cardiomegaly. No effusion or pneumothorax. EKG leads create  artifact across the chest IMPRESSION: 1. Limited low volume chest without edema or convincing pneumonia. 2. Chronic cardiomegaly. Electronically Signed   By: Monte Fantasia M.D.   On: 01/20/2016 19:07    Labs:  CBC:  Recent Labs  01/03/16 1953 12/31/2015 1815 01/16/16 0400  WBC 13.9* 16.9* 15.7*  HGB 11.7* 12.1* 9.6*  HCT 36.8* 37.2* 29.7*  PLT 300 133* 121*    COAGS:  Recent Labs  01/16/16 0400  INR 2.10    BMP:  Recent Labs  01/03/16 1953 01/16/2016 1815 01/16/16 0400  NA 139 133* 137  K 4.1 5.0 4.1  CL 104 100* 106  CO2 22 18* 19*  GLUCOSE 114* 141* 141*  BUN 38* 101* 91*  CALCIUM 8.6* 8.4* 7.4*  CREATININE 1.87* 3.13* 2.60*  GFRNONAA 30* 16* 20*  GFRAA 35* 19* 23*    LIVER FUNCTION TESTS:  Recent Labs  01/03/16 1953 01/22/2016 1959  BILITOT 1.1 2.6*  AST 59* 150*  ALT 25 69*  ALKPHOS 157* 198*  PROT 7.0 6.3*  ALBUMIN 2.8* 2.6*    TUMOR MARKERS: No results for input(s): AFPTM, CEA, CA199, CHROMGRNA in the last 8760 hours.  Assessment and Plan:  Sepsis New B DVT Cannot anticoagulate---bloody BM this am Scheduled for retrievable Inferior vena cava filter placement Risks and Benefits discussed with the patient and daughter(via phone) including, but not limited to bleeding, infection, contrast induced renal failure, filter fracture or migration which can lead to emergency surgery or even death, strut penetration with damage or irritation to adjacent structures and caval thrombosis. All of the patient's and daughter questions were answered, patient and daughter are agreeable to proceed. Consent signed and in chart.    Thank you for this interesting consult.  I greatly enjoyed meeting Shawn Booker and look forward to participating in their care.  A copy of this report was sent to the requesting provider on this date.  Electronically Signed: Taniya Dasher A 01/16/2016, 10:34 AM   I spent a total of 40 Minutes    in face to face in clinical  consultation, greater than 50% of which was counseling/coordinating care for IVC filter placement

## 2016-01-16 NOTE — Procedures (Signed)
IVC filter, tip at [place] No venous anomaly No comp/EBL

## 2016-01-16 NOTE — Progress Notes (Signed)
Pharmacy Antibiotic Note  Shawn Booker is a 82 y.o. male admitted on 12/31/2015 with hypotension and shortness of breath.  Pharmacy has been consulted for vancomycin and Zosyn dosing for sepsis.   Plan: Zosyn 2.25gm IV q8h Vancomycin 1g IV every 48 hours.  Goal trough 15-20 mcg/mL.  Monitor renal function, culture results, clinical picture, and vancomycin trough as needed.   Weight: 150 lb (68 kg)  Temp (24hrs), Avg:98 F (36.7 C), Min:97.6 F (36.4 C), Max:98.4 F (36.9 C)   Recent Labs Lab 01/16/2016 1815 01/24/2016 2001 01/16/16 0150  WBC 16.9*  --   --   CREATININE 3.13*  --   --   LATICACIDVEN  --  4.08* 1.79    Estimated Creatinine Clearance: 13.4 mL/min (by C-G formula based on SCr of 3.13 mg/dL (H)).    No Known Allergies  Antimicrobials this admission: 9/18 vanc >>  9/18 zosyn >>   Dose adjustments this admission: N/A  Microbiology results: pending   Thank you for allowing pharmacy to be a part of this patient's care.  Sherlon Handing, PharmD, BCPS Clinical pharmacist, pager [phone] 01/16/16 2:12 AM

## 2016-01-16 NOTE — Progress Notes (Addendum)
[facility] PCCM  Chart reviewed, admitted overnight for sepsis, has baseline prostate and bladder cancer metastatic to liver and peritoneum.    Bloody bowel movement this morning  On Levophed, decreasing doses CVP 18  On exam Gen: comfortable in bed HENT: NCAT OP clear PULM: CTA B Belly: soft, nontender CV: tachycardic slight murmur Feet with good cap refill, warm, acyanotic  Cbc: hgb 9, INR > 2 but not on anticoagulants   Impression/plan Septic shock> source uncertain > cellulitis/soft tissue infection feet? Continue broad spectrum antibitoics; I wonder how much of this comes from poor appetite AKI> improving, continue fluid with NS at 100cc/hr GI bleed? Poor candidate for invasive intervention, serial cbc, BID PPI for now INR elevation> due to malnourishment? Liver related? Check pre-albumin, give vit K  My cc time 30 minutes  Roselie Awkward, MD [place] [facility] Pager: [phone] Cell: [phone] After 3pm or if no response, call [phone]

## 2016-01-17 ENCOUNTER — Inpatient Hospital Stay (HOSPITAL_COMMUNITY): Payer: Medicare Other

## 2016-01-17 DIAGNOSIS — I82403 Acute embolism and thrombosis of unspecified deep veins of lower extremity, bilateral: Secondary | ICD-10-CM

## 2016-01-17 DIAGNOSIS — K922 Gastrointestinal hemorrhage, unspecified: Secondary | ICD-10-CM

## 2016-01-17 LAB — BLOOD GAS, ARTERIAL
ACID-BASE DEFICIT: 11.3 mmol/L — AB (ref 0.0–2.0)
BICARBONATE: 13.2 mmol/L — AB (ref 20.0–28.0)
DRAWN BY: 25203
O2 Content: 4 L/min
O2 Saturation: 98.5 %
PATIENT TEMPERATURE: 98.6
pCO2 arterial: 24.3 mmHg — ABNORMAL LOW (ref 32.0–48.0)
pH, Arterial: 7.353 (ref 7.350–7.450)
pO2, Arterial: 128 mmHg — ABNORMAL HIGH (ref 83.0–108.0)

## 2016-01-17 LAB — CBC WITH DIFFERENTIAL/PLATELET
BASOS ABS: 0 10*3/uL (ref 0.0–0.1)
BASOS PCT: 0 %
Eosinophils Absolute: 0 10*3/uL (ref 0.0–0.7)
Eosinophils Relative: 0 %
HEMATOCRIT: 31 % — AB (ref 39.0–52.0)
HEMOGLOBIN: 10 g/dL — AB (ref 13.0–17.0)
Lymphocytes Relative: 5 %
Lymphs Abs: 0.8 10*3/uL (ref 0.7–4.0)
MCH: 29 pg (ref 26.0–34.0)
MCHC: 32.3 g/dL (ref 30.0–36.0)
MCV: 89.9 fL (ref 78.0–100.0)
Monocytes Absolute: 0.5 10*3/uL (ref 0.1–1.0)
Monocytes Relative: 3 %
NEUTROS PCT: 92 %
Neutro Abs: 15.3 10*3/uL — ABNORMAL HIGH (ref 1.7–7.7)
Platelets: 100 10*3/uL — ABNORMAL LOW (ref 150–400)
RBC: 3.45 MIL/uL — AB (ref 4.22–5.81)
RDW: 16.8 % — ABNORMAL HIGH (ref 11.5–15.5)
WBC: 16.6 10*3/uL — ABNORMAL HIGH (ref 4.0–10.5)

## 2016-01-17 LAB — BASIC METABOLIC PANEL
ANION GAP: 9 (ref 5–15)
BUN: 85 mg/dL — ABNORMAL HIGH (ref 6–20)
CHLORIDE: 114 mmol/L — AB (ref 101–111)
CO2: 16 mmol/L — ABNORMAL LOW (ref 22–32)
Calcium: 7.3 mg/dL — ABNORMAL LOW (ref 8.9–10.3)
Creatinine, Ser: 2.78 mg/dL — ABNORMAL HIGH (ref 0.61–1.24)
GFR calc Af Amer: 21 mL/min — ABNORMAL LOW (ref >60)
GFR, EST NON AFRICAN AMERICAN: 18 mL/min — AB (ref >60)
GLUCOSE: 138 mg/dL — AB (ref 65–99)
POTASSIUM: 4.6 mmol/L (ref 3.5–5.1)
Sodium: 139 mmol/L (ref 135–145)

## 2016-01-17 LAB — TROPONIN I: TROPONIN I: 0.08 ng/mL — AB (ref <0.03)

## 2016-01-17 LAB — GLUCOSE, CAPILLARY: GLUCOSE-CAPILLARY: 136 mg/dL — AB (ref 65–99)

## 2016-01-17 LAB — PROCALCITONIN: Procalcitonin: 5.55 ng/mL

## 2016-01-17 LAB — MAGNESIUM: MAGNESIUM: 2.4 mg/dL (ref 1.7–2.4)

## 2016-01-17 LAB — CALCIUM, IONIZED: CALCIUM, IONIZED, SERUM: 4.2 mg/dL — AB (ref 4.5–5.6)

## 2016-01-17 MED ORDER — ASPIRIN 300 MG RE SUPP
150.0000 mg | Freq: Every day | RECTAL | Status: DC
  Administered 2016-01-17: 150 mg via RECTAL
  Filled 2016-01-17 (×2): qty 1

## 2016-01-17 MED ORDER — ENSURE ENLIVE PO LIQD
237.0000 mL | Freq: Two times a day (BID) | ORAL | Status: DC
  Administered 2016-01-17: 237 mL via ORAL

## 2016-01-17 MED ORDER — SODIUM CHLORIDE 0.9 % IV BOLUS (SEPSIS)
500.0000 mL | Freq: Once | INTRAVENOUS | Status: AC
  Administered 2016-01-17: 500 mL via INTRAVENOUS

## 2016-01-17 MED ORDER — SODIUM CHLORIDE 0.9 % IV SOLN
1.0000 g | Freq: Once | INTRAVENOUS | Status: AC
  Administered 2016-01-17: 1 g via INTRAVENOUS
  Filled 2016-01-17: qty 10

## 2016-01-17 MED ORDER — METOPROLOL TARTRATE 5 MG/5ML IV SOLN
2.5000 mg | Freq: Four times a day (QID) | INTRAVENOUS | Status: DC
  Filled 2016-01-17: qty 5

## 2016-01-17 NOTE — Progress Notes (Signed)
[facility] Progress Note Patient Name: Shawn Booker DOB: Jun 30, 1922 MRN: [id number]   Date of Service  01/17/2016  HPI/Events of Note  Hypoxia - Increased O2 requirement to 4 L/min [place] O2 and RR = 37.  eICU Interventions  Will order: 1. STAT portable CXR now. 2. ABG STAT.      Intervention Category Major Interventions: Hypoxemia - evaluation and management  Marvis Saefong Eugene 01/17/2016, 10:46 PM

## 2016-01-17 NOTE — Progress Notes (Signed)
eLink Physician-Brief Progress Note Patient Name: MOSTYN BUETER DOB: March 10, 1923 MRN: [id number]   Date of Service  01/17/2016  HPI/Events of Note  Elevated HR - Rate = 127. Looks regular. Hard to determine rhythm from bedside monitor.   eICU Interventions  Will order: 1. 12 Lead EKG STAT.     Intervention Category Major Interventions: Arrhythmia - evaluation and management  Jenny Lai Eugene 01/17/2016, 7:54 PM

## 2016-01-17 NOTE — Care Management Note (Addendum)
Case Management Note  Patient Details  Name: Shawn Booker MRN: [id number] Date of Birth: Dec 02, 1922  Subjective/Objective:    Pt admitted with leg swelling, hypotension and change in mental status, new bilateral DVT.  Pt has hx of cancer that has spread to liver and peritoneum.          Action/Plan:  Pt alert and oriented - states he lives independently with adult daughter, has very supportive neighbors.  Pt stated that he feels deconditioned.  Pt is scheduled for IVC filter.  Pt will need PT/OT eval when medically stable - CM will request attending to determine when evaluation is appropriate and order eval.  CM will continue to follow for discharge needs Expected Discharge Date:                  Expected Discharge Plan:  [place]  In-House Referral:     Discharge planning Services  CM Consult  Post Acute Care Choice:    Choice offered to:     DME Arranged:    DME Agency:     HH Arranged:    [place] Agency:     Status of Service:  In process, will continue to follow  If discussed at Long Length of Stay Meetings, dates discussed:    Additional Comments:  01/17/2016 CM contacted by pts daughter Caren Griffins.  CM informed that pt actually stays alone in his home and that daughter resides in [place].  Daughter shared concerns regarding pt returning back home alone and current cognition not mirroring baseline. CM informed daughter that PT/OT eval will be provided when pt is medically stable for evaluation (pt has DVT and filter placed yesterday) - CM was informed by bedside nurse pt is stable for evaluation.  PT/OT ordered.  CM will continue to follow for discharge needs - CSW informed of tentative referral Maryclare Labrador, RN 01/17/2016, 1:27 PM

## 2016-01-17 NOTE — Progress Notes (Signed)
[facility] Progress Note Patient Name: Shawn Booker DOB: [birth year] MRN: [id number]   Date of Service  01/17/2016  HPI/Events of Note  EKG reveals Sinus Tachycardia - HR = 127. ST-T changes - consider anterior ischemia. PVC's  eICU Interventions  Will order: 1. Cycle Troponin. 2. ASA Suppository 150 mg PR now and Q day. 3. 0.9 NaCl 500 mL IV now. 4. Metoprolol 2.5 mg IV now and Q 6 hours. Hold for SBP < 100 or HR < 60. 5. BMP and Mg ++ level now.      Intervention Category Major Interventions: Acid-Base disturbance - evaluation and management;Arrhythmia - evaluation and management  Sommer,Steven Eugene 01/17/2016, 8:34 PM

## 2016-01-17 NOTE — Progress Notes (Signed)
PULMONARY / CRITICAL CARE MEDICINE   Name: Shawn Booker MRN: [id number] DOB: [birth year]    ADMISSION DATE:  01/27/2016 CONSULTATION DATE:  01/16/16  REFERRING MD:  Sabra Heck - EDP  CHIEF COMPLAINT:  Cold feet   SUBJECTIVE:  RN reports pt off levophed since 4pm on 9/19.  No acute events. IVC filter placed [id number] without complication.  Melena last 9/19 PM, Hgb stable.    VITAL SIGNS: BP 102/90   Pulse (!) 130   Temp 97.5 F (36.4 C) (Oral)   Resp (!) 32   Wt 156 lb 4.9 oz (70.9 kg)   SpO2 90%   BMI 26.01 kg/m   HEMODYNAMICS: CVP:  [7 mmHg-15 mmHg] 10 mmHg  VENTILATOR SETTINGS:    INTAKE / OUTPUT: I/O last 3 completed shifts: In: 9594.5 [I.V.:5094.5; IV Piggyback:4500] Out: 2890 [Urine:2890]   PHYSICAL EXAMINATION: General: Elderly male, in NAD lying in bed. Neuro: opens eyes to voice, appropriate, speech clear, generalized weakness but moves all ext's HEENT: [place]/AT. PERRL, sclerae anicteric. Cardiovascular: Tachy, regular, no M/R/G.  Lungs: even/non-labored, lungs bilaterally clear, no wheeze or rhonchi   Abdomen: + BS.  + hepatomegaly with tenderness in epigastrium. Musculoskeletal: No gross deformities, no edema.  Skin: Intact, warm, no rashes.  LABS:  BMET  Recent Labs Lab 01/09/2016 1815 01/16/16 0400  NA 133* 137  K 5.0 4.1  CL 100* 106  CO2 18* 19*  BUN 101* 91*  CREATININE 3.13* 2.60*  GLUCOSE 141* 141*    Electrolytes  Recent Labs Lab 01/07/2016 1815 01/16/16 0400  CALCIUM 8.4* 7.4*  MG  --  2.3  PHOS  --  4.1    CBC  Recent Labs Lab 01/24/2016 1815 01/16/16 0400 01/17/16 0612  WBC 16.9* 15.7* 16.6*  HGB 12.1* 9.6* 10.0*  HCT 37.2* 29.7* 31.0*  PLT 133* 121* 100*    Coag's  Recent Labs Lab 01/16/16 0400  INR 2.10    Sepsis Markers  Recent Labs Lab 01/07/2016 2001 01/16/16 0150 01/16/16 0400  LATICACIDVEN 4.08* 1.79  --   PROCALCITON  --   --  6.71    ABG No results for input(s): PHART, PCO2ART, PO2ART in the last  168 hours.  Liver Enzymes  Recent Labs Lab 01/16/2016 1959  AST 150*  ALT 69*  ALKPHOS 198*  BILITOT 2.6*  ALBUMIN 2.6*    Cardiac Enzymes  Recent Labs Lab 01/16/16 0400  TROPONINI 0.07*    Glucose  Recent Labs Lab 01/12/2016 1825  GLUCAP 113*    Imaging Ir Venogram Renal Uni Right  Result Date: 01/16/2016 INDICATION: Bilateral lower extremity DVT.  Gastrointestinal bleeding. EXAM: IVC FILTER,INFERIOR VENA CAVOGRAM MEDICATIONS: None. ANESTHESIA/SEDATION: Fentanyl 0 mcg IV; Versed 0.5 mg IV Moderate Sedation Time:  0 minutes FLUOROSCOPY TIME:  Fluoroscopy Time: 3 minutes 12 seconds (654 mGy). COMPLICATIONS: None immediate. PROCEDURE: Informed written consent was obtained from the patient after a thorough discussion of the procedural risks, benefits and alternatives. All questions were addressed. Maximal Sterile Barrier Technique was utilized including caps, mask, sterile gowns, sterile gloves, sterile drape, hand hygiene and skin antiseptic. A timeout was performed prior to the initiation of the procedure. The right neck was prepped with Betadine in a sterile fashion, and a sterile drape was applied covering the operative field. A sterile gown and sterile gloves were used for the procedure. The right jugular vein vein was noted to be patent initially with ultrasound. Under sonographic guidance, a micropuncture needle was inserted into the right jugular vein (Ultrasound image  documentation was performed). It was removed over an 018 wire which was up-sized to a [place]. The sheath was inserted over the wire and into the lower IVC. IVC carbon dioxide venography was performed. The catheter was retracted to the renal vein inflow and repeat IVC venography was performed. A An MPA catheter was then advanced through the sheath and the right renal vein was selected. Right renal venography was performed. The temporary filter was then deployed in the infrarenal IVC. The sheath was removed and  hemostasis was achieved with direct pressure. FINDINGS: IVC venography confirms IVC bifurcation at L5 as well as the left renal vein inflow at L2. Right renal vein venography confirms renal vein inflow at L2. The final image demonstrates aid anomaly temporary IVC filter with its tip at mid L2. IMPRESSION: Successful infrarenal IVC filter placement. This is a temporary filter. It can be removed or remain in place to become permanent. Electronically Signed   By: Marybelle Killings M.D.   On: 01/16/2016 17:05   Ir Ivc Filter Plmt / S&i /img Guid/mod Sed  Result Date: 01/16/2016 INDICATION: Bilateral lower extremity DVT.  Gastrointestinal bleeding. EXAM: IVC FILTER,INFERIOR VENA CAVOGRAM MEDICATIONS: None. ANESTHESIA/SEDATION: Fentanyl 0 mcg IV; Versed 0.5 mg IV Moderate Sedation Time:  0 minutes FLUOROSCOPY TIME:  Fluoroscopy Time: 3 minutes 12 seconds (654 mGy). COMPLICATIONS: None immediate. PROCEDURE: Informed written consent was obtained from the patient after a thorough discussion of the procedural risks, benefits and alternatives. All questions were addressed. Maximal Sterile Barrier Technique was utilized including caps, mask, sterile gowns, sterile gloves, sterile drape, hand hygiene and skin antiseptic. A timeout was performed prior to the initiation of the procedure. The right neck was prepped with Betadine in a sterile fashion, and a sterile drape was applied covering the operative field. A sterile gown and sterile gloves were used for the procedure. The right jugular vein vein was noted to be patent initially with ultrasound. Under sonographic guidance, a micropuncture needle was inserted into the right jugular vein (Ultrasound image documentation was performed). It was removed over an 018 wire which was up-sized to a [place]. The sheath was inserted over the wire and into the lower IVC. IVC carbon dioxide venography was performed. The catheter was retracted to the renal vein inflow and repeat IVC venography  was performed. A An MPA catheter was then advanced through the sheath and the right renal vein was selected. Right renal venography was performed. The temporary filter was then deployed in the infrarenal IVC. The sheath was removed and hemostasis was achieved with direct pressure. FINDINGS: IVC venography confirms IVC bifurcation at L5 as well as the left renal vein inflow at L2. Right renal vein venography confirms renal vein inflow at L2. The final image demonstrates aid anomaly temporary IVC filter with its tip at mid L2. IMPRESSION: Successful infrarenal IVC filter placement. This is a temporary filter. It can be removed or remain in place to become permanent. Electronically Signed   By: Marybelle Killings M.D.   On: 01/16/2016 17:05     STUDIES:  CXR 9/19 > no acute process. Venous duplex 9/19 > postive for acute DVT in R femoral vein, R popliteal vein, R posterior tibial, & R peroneal veins, positive for L acute DVT in left popliteal vein, left posterior tibial veins, left peroneal veins and superficial vein involving the left small saphenous vein   CULTURES: Blood 9/19 > Urine 9/19 > 10k insignificant growth Sputum 9/19 >  ANTIBIOTICS: Vanc 9/19 >  Zosyn 9/19 >  SIGNIFICANT EVENTS: 9/19  Admit. 9/20  Off pressors, tx to med surg  LINES/TUBES: R IJ CVL 9/19 > 9/20  DISCUSSION: 82 y.o. male with hx prostate and bladder CA now with recently found metastatic disease to liver and peritoneum, admitted 9/19 with shock.  Suspect combo of septic + hypovolemic.  ASSESSMENT / PLAN:  CARDIOVASCULAR A:  Shock - suspect combo of septic (unclear etiology) + hypovolemic.  Lactate clearance reassuring.  Likely hypovolemic in the setting of poor PO intake. BLE DVT - acute dvt on [place] 9/19 s/p IVC filter Possible PVD. Hx PSVT, HTN, MVP.  P:  NS @ 100 ml/hr Transfer to tele  Keep central line for now in the setting of melena   INFECTIOUS A:   Shock - resolved, suspect hypovolemic but r/o  infectious etiology.  Lactate clearance reassuring. P:   Abx as above (vanc / zosyn).  Follow cultures as above. PCT algorithm to limit abx exposure.  HEMATOLOGIC / ONCOLOGIC  A:   Hx prostate and bladder CA with newly found mets to liver and peritoneum (as seen on CT 01/03/16). Acute BLE DVT - as seen on [place] 9/19. Coagulopathy  Thrombocytopenia. VTE Prophylaxis. P:  F/u with oncology as outpatient. Needs further goals of care discussions, palliative care consultation  IVC filter No SCD's with BLE DVT Monitor platelet counts CBC in AM  PULMONARY A: No acute issues. P:   No interventions required.  RENAL A:   CKD. Pseudohypocalcemia - corrects to 9.52.   P:   NS @ 100. Trend BMP / UOP  Replace electrolytes as indicated  GASTROINTESTINAL A:   Melena - new, 9/19, hgb stable  Protein Calorie Malnutrition  P:   Diet as tolerated  Protonix BID  Add ensure BID Monitor for further bleeding / stable Hgb  May need GI input for melena if clinical deterioration.   ENDOCRINE A:   No acute issues. P:   Monitor glucose on BMP   NEUROLOGIC A:   No acute issues. P:   Avoid sedating meds.  Family updated:  Daughter, Caren Griffins [phone]) called for an update.  No answer, message left for return call.   Interdisciplinary Family Meeting v Palliative Care Meeting:  Due by: 9/26  - ongoing.  Palliative Care consult pending,appreciate input.     Transfer to [facility] as of 9/21 am.      Noe Gens, NP-C [facility] Pgr: [phone] or if no answer [phone] 01/17/2016, 1:41 PM  Attending:  I have seen and examined the patient with nurse practitioner/resident and agree with the note above.  We formulated the plan together and I elicited the following history.    Mr. Wamboldt had another bloody bowel movement Blood pressure better  On exam Confused Belly soft Lungs clear Edema legs  Hgb stable  DVT> s/p IVC filter GI bleed> Hgb stable,  hemodynamic stable, may need GI consult if worsens Overall prognosis seems poor, I worry he was not well cared for prior to admission as he was living alone and appears malnourished.  Will consult palliative medicine  Move out of ICU  Roselie Awkward, MD [facility] Pager: [phone] Cell: [phone] After 3pm or if no response, call [phone]

## 2016-01-18 DIAGNOSIS — R571 Hypovolemic shock: Secondary | ICD-10-CM

## 2016-01-18 DIAGNOSIS — N179 Acute kidney failure, unspecified: Secondary | ICD-10-CM

## 2016-01-18 DIAGNOSIS — K625 Hemorrhage of anus and rectum: Secondary | ICD-10-CM | POA: Diagnosis present

## 2016-01-18 DIAGNOSIS — D62 Acute posthemorrhagic anemia: Secondary | ICD-10-CM | POA: Diagnosis present

## 2016-01-18 DIAGNOSIS — K922 Gastrointestinal hemorrhage, unspecified: Secondary | ICD-10-CM

## 2016-01-18 DIAGNOSIS — E872 Acidosis, unspecified: Secondary | ICD-10-CM | POA: Diagnosis present

## 2016-01-18 DIAGNOSIS — C787 Secondary malignant neoplasm of liver and intrahepatic bile duct: Secondary | ICD-10-CM | POA: Diagnosis present

## 2016-01-18 DIAGNOSIS — I82409 Acute embolism and thrombosis of unspecified deep veins of unspecified lower extremity: Secondary | ICD-10-CM | POA: Diagnosis present

## 2016-01-18 DIAGNOSIS — I824Z3 Acute embolism and thrombosis of unspecified deep veins of distal lower extremity, bilateral: Secondary | ICD-10-CM

## 2016-01-18 LAB — CBC
HCT: 27.9 % — ABNORMAL LOW (ref 39.0–52.0)
HEMATOCRIT: 27.4 % — AB (ref 39.0–52.0)
Hemoglobin: 8.9 g/dL — ABNORMAL LOW (ref 13.0–17.0)
Hemoglobin: 8.9 g/dL — ABNORMAL LOW (ref 13.0–17.0)
MCH: 28.8 pg (ref 26.0–34.0)
MCH: 29.2 pg (ref 26.0–34.0)
MCHC: 31.9 g/dL (ref 30.0–36.0)
MCHC: 32.5 g/dL (ref 30.0–36.0)
MCV: 90.3 fL (ref 78.0–100.0)
MCV: 89.8 fL (ref 78.0–100.0)
Platelets: 83 10*3/uL — ABNORMAL LOW (ref 150–400)
Platelets: 67 10*3/uL — ABNORMAL LOW (ref 150–400)
RBC: 3.09 MIL/uL — ABNORMAL LOW (ref 4.22–5.81)
RBC: 3.05 MIL/uL — ABNORMAL LOW (ref 4.22–5.81)
RDW: 17.3 % — ABNORMAL HIGH (ref 11.5–15.5)
RDW: 17.9 % — AB (ref 11.5–15.5)
WBC: 15.5 10*3/uL — ABNORMAL HIGH (ref 4.0–10.5)
WBC: 16.7 10*3/uL — AB (ref 4.0–10.5)

## 2016-01-18 LAB — BASIC METABOLIC PANEL
Anion gap: 12 (ref 5–15)
Anion gap: 10 (ref 5–15)
BUN: 85 mg/dL — ABNORMAL HIGH (ref 6–20)
BUN: 88 mg/dL — ABNORMAL HIGH (ref 6–20)
CO2: 16 mmol/L — ABNORMAL LOW (ref 22–32)
CO2: 16 mmol/L — ABNORMAL LOW (ref 22–32)
Calcium: 7.3 mg/dL — ABNORMAL LOW (ref 8.9–10.3)
Calcium: 7.4 mg/dL — ABNORMAL LOW (ref 8.9–10.3)
Chloride: 110 mmol/L (ref 101–111)
Chloride: 112 mmol/L — ABNORMAL HIGH (ref 101–111)
Creatinine, Ser: 2.88 mg/dL — ABNORMAL HIGH (ref 0.61–1.24)
Creatinine, Ser: 2.95 mg/dL — ABNORMAL HIGH (ref 0.61–1.24)
GFR calc Af Amer: 21 mL/min — ABNORMAL LOW (ref >60)
GFR calc Af Amer: 20 mL/min — ABNORMAL LOW (ref >60)
GFR calc non Af Amer: 18 mL/min — ABNORMAL LOW (ref >60)
GFR calc non Af Amer: 17 mL/min — ABNORMAL LOW (ref >60)
Glucose, Bld: 151 mg/dL — ABNORMAL HIGH (ref 65–99)
Glucose, Bld: 140 mg/dL — ABNORMAL HIGH (ref 65–99)
Potassium: 4.6 mmol/L (ref 3.5–5.1)
Potassium: 4.6 mmol/L (ref 3.5–5.1)
Sodium: 138 mmol/L (ref 135–145)
Sodium: 138 mmol/L (ref 135–145)

## 2016-01-18 LAB — TROPONIN I
TROPONIN I: 0.09 ng/mL — AB (ref <0.03)
TROPONIN I: 0.08 ng/mL — AB (ref <0.03)

## 2016-01-18 LAB — PREPARE RBC (CROSSMATCH)

## 2016-01-18 MED ORDER — SODIUM CHLORIDE 0.9 % IV BOLUS (SEPSIS)
250.0000 mL | Freq: Once | INTRAVENOUS | Status: AC
  Administered 2016-01-18: 250 mL via INTRAVENOUS

## 2016-01-18 MED ORDER — MORPHINE SULFATE (PF) 4 MG/ML IV SOLN
INTRAVENOUS | Status: AC
  Administered 2016-01-18: 4 mg
  Filled 2016-01-18: qty 1

## 2016-01-18 MED ORDER — SODIUM CHLORIDE 0.9 % IV SOLN: Freq: Once | INTRAVENOUS | Status: DC

## 2016-01-19 LAB — TYPE AND SCREEN
ABO/RH(D): O POS
ANTIBODY SCREEN: NEGATIVE
UNIT DIVISION: 0

## 2016-01-19 MED FILL — Medication: Qty: 1 | Status: AC

## 2016-01-20 LAB — CULTURE, BLOOD (ROUTINE X 2)
CULTURE: NO GROWTH
CULTURE: NO GROWTH

## 2016-01-25 ENCOUNTER — Telehealth: Payer: Self-pay

## 2016-01-25 NOTE — Telephone Encounter (Signed)
On 01/29/2016 I received a death certificate from [employer]. The patient is a patient of Doctor McQuaid. The death certificate will be taken to Pulmonary Unit at [facility] this pm for signature.  On 2016/02/21 I received the death certificate back from [facility]. I got the death certificate ready and called the funeral home to let them know the death certificate is ready for pickup.

## 2016-01-28 NOTE — Progress Notes (Addendum)
MD ordered 1 unit PRBC.  RN called number provided for 1st point of contact to obtain blood consent.  Message was left on voicemail with callback number. RN also called emergency contact (sister) but got no answer.  Pt is not oriented well enough to sign his own consent. Hold blood until consent can be given.

## 2016-01-28 NOTE — Progress Notes (Signed)
[facility]   Called emergently to the bedside due to cardiac arrest.  The patient is well known to me from yesterday.  Has known metastatic cancer from bladder and prostate cancer and was admitted with a possible sepsis syndrome.  After admission he was noted to have lower extremity dopplers and he had a lower GI bleed.    IVC filter placed.  Overnight 9/20 to 9/21 his condition worsened, required BIPAP.    Later today he developed a cardiac arrest, PEA arrest.    On my arrival he was receiving CPR.  Immediately on my arrival I stopped resuscitative efforts.  He is 82 y/o with metastatic cancer and multiple active acute issues and has no chance of survival.  I instructed that he have morphine administered IV.   Roselie Awkward, MD [facility] Pager: [phone] Cell: [phone] After 3pm or if no response, call [phone]

## 2016-01-28 NOTE — Consult Note (Addendum)
Referring Provider: Dr. Maryland Pink  Primary Care Physician:  Elyn Peers, MD Primary Gastroenterologist:  Althia Forts  Reason for Consultation:  GI bleed  HPI: Shawn Booker is a 82 y.o. male was admitted to the hospital for evaluation of bilateral foot pain concerning for vascular disease. Patient noted to be hypotensive and was admitted to the ICU for further management. CT scan of abdomen was performed which revealed metastatic disease with diffuse hepatic metastases.. Also found to have a DVT currently status post IVC filter placement. Patient started noticing bright blood per rectum and GI is consulted  for further evaluation. Caregiver is present in the room at this time. Discussed with the nursing staff. Had 1 episode of bright red blood per rectum yesterday. No bowel movement today. Patient denied abdominal pain, nausea or vomiting. Had difficulty breathing requiring use of BiPAP. No prior colonoscopy  Past Medical History:  Diagnosis Date  . Anemia   . Bladder cancer ([place])   . CKD (chronic kidney disease)   . H/O osteopenia   . HTN (hypertension)   . MVP (mitral valve prolapse)   . Paroxysmal supraventricular tachycardia ([place])   . Prostate cancer [facility])     Past Surgical History:  Procedure Laterality Date  . Bladder Tumor, Cysto with fulguration    . HERNIA REPAIR    . IR GENERIC HISTORICAL  01/16/2016   IR IVC FILTER PLMT / S&I /IMG GUID/MOD SED 01/16/2016 [facility]INTER[facility] RAD  . IR GENERIC HISTORICAL  01/16/2016   IR VENOGRAM RENAL UNI RIGHT 01/16/2016 [facility]INTER[facility]  . TRANSURETHRAL RESECTION OF PROSTATE      Prior to Admission medications   Medication Sig Start Date End Date Taking? Authorizing Provider  Bioflavonoid Products (ESTER-C) 500-200-60 MG TABS Take 2 tablets by mouth daily.   Yes Historical Provider, MD  furosemide (LASIX) 80 MG tablet Take 80 mg by mouth 2 (two) times daily.    Yes Historical Provider, MD  OVER THE COUNTER MEDICATION Take 1 tablet by mouth daily.    Yes Historical Provider, MD  Ubiquinol 100 MG CAPS Take 100 mg by mouth daily.   Yes Historical Provider, MD  HYDROcodone-acetaminophen (NORCO/VICODIN) 5-325 MG tablet Take 1 tablet by mouth every 6 (six) hours as needed. Patient not taking: Reported on 01/14/2016 01/04/16   Merryl Hacker, MD    Scheduled Meds: . sodium chloride   Intravenous Once  . feeding supplement (ENSURE ENLIVE)  237 mL Oral BID BM  . pantoprazole (PROTONIX) IV  40 mg Intravenous Q12H  . piperacillin-tazobactam (ZOSYN)  IV  2.25 g Intravenous Q8H  . vancomycin  1,000 mg Intravenous Q48H   Continuous Infusions: . sodium chloride 100 mL/hr at Feb 07, 2016 1200   PRN Meds:.sodium chloride  Allergies as of 01/25/2016  . (No Known Allergies)    Family History  Problem Relation Age of Onset  . Heart disease Mother     Social History   Social History  . Marital status: Divorced    Spouse name: N/A  . Number of children: N/A  . Years of education: N/A   Occupational History  . Not on file.   Social History Main Topics  . Smoking status: Never Smoker  . Smokeless tobacco: Never Used  . Alcohol use No  . Drug use: No  . Sexual activity: Not on file   Other Topics Concern  . Not on file   Social History Narrative  . No narrative on file    Review of Systems: Not able to  obtain  Physical Exam: Vital signs: Vitals:   2016/01/30 1144 30-Jan-2016 1200  BP:  131/70  Pulse:    Resp:  (!) 28  Temp: 97.6 F (36.4 C)    Last BM Date: 01/16/16 General:   Alert,  But ill-appearing Lungs:  Bibasilar crackles Heart:  Regular rate and rhythm; no murmurs, clicks, rubs,  or gallops. Abdomen: Soft, nondistended, bowel sounds present, nontender LE : 1+ edema Rectal:  Deferred  GI:  Lab Results:  Recent Labs  01/17/16 0612 01-30-2016 0231 2016/01/30 1000  WBC 16.6* 15.5* 16.7*  HGB 10.0* 8.9* 8.9*  HCT 31.0* 27.9* 27.4*  PLT 100* 83* 67*   BMET  Recent Labs  01/17/16 2031 Jan 30, 2016 0231  Jan 30, 2016 0745  NA 139 138 138  K 4.6 4.6 4.6  CL 114* 110 112*  CO2 16* 16* 16*  GLUCOSE 138* 151* 140*  BUN 85* 85* 88*  CREATININE 2.78* 2.88* 2.95*  CALCIUM 7.3* 7.3* 7.4*   LFT  Recent Labs  01/22/2016 1959  PROT 6.3*  ALBUMIN 2.6*  AST 150*  ALT 69*  ALKPHOS 198*  BILITOT 2.6*  BILIDIR 1.5*  IBILI 1.1*   PT/INR  Recent Labs  01/16/16 0400  LABPROT 23.9*  INR 2.10     Studies/Results: Ir Venogram Renal Uni Right  Result Date: 01/16/2016 INDICATION: Bilateral lower extremity DVT.  Gastrointestinal bleeding. EXAM: IVC FILTER,INFERIOR VENA CAVOGRAM MEDICATIONS: None. ANESTHESIA/SEDATION: Fentanyl 0 mcg IV; Versed 0.5 mg IV Moderate Sedation Time:  0 minutes FLUOROSCOPY TIME:  Fluoroscopy Time: 3 minutes 12 seconds (654 mGy). COMPLICATIONS: None immediate. PROCEDURE: Informed written consent was obtained from the patient after a thorough discussion of the procedural risks, benefits and alternatives. All questions were addressed. Maximal Sterile Barrier Technique was utilized including caps, mask, sterile gowns, sterile gloves, sterile drape, hand hygiene and skin antiseptic. A timeout was performed prior to the initiation of the procedure. The right neck was prepped with Betadine in a sterile fashion, and a sterile drape was applied covering the operative field. A sterile gown and sterile gloves were used for the procedure. The right jugular vein vein was noted to be patent initially with ultrasound. Under sonographic guidance, a micropuncture needle was inserted into the right jugular vein (Ultrasound image documentation was performed). It was removed over an 018 wire which was up-sized to a [place]. The sheath was inserted over the wire and into the lower IVC. IVC carbon dioxide venography was performed. The catheter was retracted to the renal vein inflow and repeat IVC venography was performed. A An MPA catheter was then advanced through the sheath and the right renal vein was  selected. Right renal venography was performed. The temporary filter was then deployed in the infrarenal IVC. The sheath was removed and hemostasis was achieved with direct pressure. FINDINGS: IVC venography confirms IVC bifurcation at L5 as well as the left renal vein inflow at L2. Right renal vein venography confirms renal vein inflow at L2. The final image demonstrates aid anomaly temporary IVC filter with its tip at mid L2. IMPRESSION: Successful infrarenal IVC filter placement. This is a temporary filter. It can be removed or remain in place to become permanent. Electronically Signed   By: Marybelle Killings M.D.   On: 01/16/2016 17:05   Ir Ivc Filter Plmt / S&i /img Guid/mod Sed  Result Date: 01/16/2016 INDICATION: Bilateral lower extremity DVT.  Gastrointestinal bleeding. EXAM: IVC FILTER,INFERIOR VENA CAVOGRAM MEDICATIONS: None. ANESTHESIA/SEDATION: Fentanyl 0 mcg IV; Versed 0.5 mg IV Moderate Sedation  Time:  0 minutes FLUOROSCOPY TIME:  Fluoroscopy Time: 3 minutes 12 seconds (654 mGy). COMPLICATIONS: None immediate. PROCEDURE: Informed written consent was obtained from the patient after a thorough discussion of the procedural risks, benefits and alternatives. All questions were addressed. Maximal Sterile Barrier Technique was utilized including caps, mask, sterile gowns, sterile gloves, sterile drape, hand hygiene and skin antiseptic. A timeout was performed prior to the initiation of the procedure. The right neck was prepped with Betadine in a sterile fashion, and a sterile drape was applied covering the operative field. A sterile gown and sterile gloves were used for the procedure. The right jugular vein vein was noted to be patent initially with ultrasound. Under sonographic guidance, a micropuncture needle was inserted into the right jugular vein (Ultrasound image documentation was performed). It was removed over an 018 wire which was up-sized to a [place]. The sheath was inserted over the wire and into  the lower IVC. IVC carbon dioxide venography was performed. The catheter was retracted to the renal vein inflow and repeat IVC venography was performed. A An MPA catheter was then advanced through the sheath and the right renal vein was selected. Right renal venography was performed. The temporary filter was then deployed in the infrarenal IVC. The sheath was removed and hemostasis was achieved with direct pressure. FINDINGS: IVC venography confirms IVC bifurcation at L5 as well as the left renal vein inflow at L2. Right renal vein venography confirms renal vein inflow at L2. The final image demonstrates aid anomaly temporary IVC filter with its tip at mid L2. IMPRESSION: Successful infrarenal IVC filter placement. This is a temporary filter. It can be removed or remain in place to become permanent. Electronically Signed   By: Marybelle Killings M.D.   On: 01/16/2016 17:05   Dg Chest Port 1 View  Result Date: 01/17/2016 CLINICAL DATA:  Acute onset of tachypnea.  Initial encounter. EXAM: PORTABLE CHEST 1 VIEW COMPARISON:  Chest radiograph performed 01/16/2016 FINDINGS: A small left pleural effusion is noted. Vascular congestion is seen. Increased interstitial markings raise concern for mild pulmonary edema. No pneumothorax identified. The cardiothymic silhouette is mildly enlarged. A right IJ line is noted ending about the distal SVC. No acute osseous abnormalities are seen. IMPRESSION: Small left pleural effusion noted. Increased interstitial markings raise concern for mild pulmonary edema. Vascular congestion and mild cardiomegaly. Electronically Signed   By: Garald Balding M.D.   On: 01/17/2016 23:21    Impression/Plan: Painless hematochezia. No bowel movement today. Hemoglobin stable Abnormal CT scan showing diffuse hepatic metastasis. Patient also had soft tissue nodules within the subcutaneous fat concerning for metastatic disease Respiratory failure requiring use of BiPAP History of prostate  cancer  Recommendations ------------------------ - Monitor H&H. Okay to have  diet from GI standpoint. Diet is currently hold because of the respiratory insufficiency in need of BiPAP - Patient with multiple medical comorbidities including possible metastatic disease. Caregiver are leaning towards comfort care - No plan for endoscopic intervention from GI standpoint at this time - Consider palliative care consult - GI will follow.   LOS: 2 days   Otis Brace  MD, FACP January 25, 2016, 12:33 PM  Pager [phone] If no answer or after 5 PM call [phone]

## 2016-01-28 NOTE — Progress Notes (Signed)
[facility] Progress Note Patient Name: CROSS GOLDBECK DOB: 08-29-1922 MRN: [id number]   Date of Service  02-06-16  HPI/Events of Note  Patient with Large [place] BM Hgb 10-->8.9 HR 129, RR 33 BP 94/76  eICU Interventions  Recommend 1 unit PRBC     Intervention Category Evaluation Type: Other  Flora Lipps 02/06/16, 4:51 AM

## 2016-01-28 NOTE — Code Documentation (Signed)
CPR initiated at 1441 for PEA arrest.  I intubated this pt at 1443 with a DL MAC 4, 7.5 ETT and it was secured at 26 at the lip.  Positive, equal breath sounds were auscultated BIL with no sounds auscultated over the stomach.  Color change was positive with the ETCO2 and ETCO2 detection read 11 on zoll.  Pt bagged until orders were given from Dr. [place] Bells to extubate pt after morphine was pushed by RN.  Morphine was pushed and pt was extubated at 1458 to room air.

## 2016-01-28 NOTE — Progress Notes (Signed)
Patient ID: Shawn Booker, male   DOB: 1923-02-17, 82 y.o.   MRN: [id number] Less responsive. No change in lower extremities. IVC filter placed for lower from a DVT by IR Nothing to add. Agree with palliative care valuation for goals of care Call if we can assist

## 2016-01-28 NOTE — Progress Notes (Signed)
OT Cancellation Note  Patient Details Name: Shawn Booker MRN: [id number] DOB: [birth year]   Cancelled Treatment:    Reason Eval/Treat Not Completed: Medical issues which prohibited therapy. Pt lethargic and on bipap. Will follow.  Malka So 26-Jan-2016, 8:13 AM  [phone]

## 2016-01-28 NOTE — Discharge Summary (Addendum)
Death Summary  Shawn Booker [id number] DOB: [birth year] DOA: 2016/02/11  PCP: Elyn Peers, MD   Admit date: 11-Feb-2016 Date of Death: February 14, 2016  Final Diagnoses:  Principal Problem:   Hypovolemic shock ([place]) Active Problems:   Prostate cancer ([place])   Chronic kidney disease, stage III (moderate)   Sepsis ([place])   AKI (acute kidney injury) ([place])   Metastases to the liver ([facility])   Metabolic acidosis   BRBPR (bright red blood per rectum)   Acute blood loss anemia   DVT (deep venous thrombosis) ([facility])     History of present illness:  82 year old African-American male with a has medical history of bladder and prostate cancer , developed bilateral foot pain for which he went to see his primary care provider. There was some concern for vascular compromise, plus the patient was noted to have hypotension and was somnolent. So he was sent over to the emergency department for further management. Patient was self seen by vascular surgery. There was no indication for any intervention. He was found to be hypotensive and was given fluids and pressors. Patient was admitted to the intensive care unit. CT scan of the abdomen showed metastatic disease presumably from his prostate cancer. He also was found to have bilateral DVTs. Patient underwent IVC filter placement. Patient also was noted to have black stools and there were also a few episodes of bright red blood in his stool.  Hospital Course:   He was apparently feeling more short of breath this morning. However his sats were maintained on the bipap. Then patient became unresponsive and went into cardiac arrest.  CPR was initiated. ACLS protocol was followed. He did regain pulse twice but did not maintain it. Subsequently all resuscitative efforts were stopped. Patient passed away at 1459hrs. His daughter, Shawn Booker was called informed of his death. Dr. Lake Bells with [facility] was at bedside as well. He will sign death certificate.  Other  issues: Shock, possibly hypovolemic versus septic Patient required pressors briefly. He did have elevated pro-calcitonin level. He did have an elevated lactic acid level, which improved. Patient was placed on broad-spectrum antibiotics with vancomycin and Zosyn. Blood cultures are negative so far. Blood pressure has improved, though remains low. There might be some element of GI bleed, which is also causing low blood pressure. He was noted to be tachycardic, which could be due to some degree of hypovolemia.   Acute respiratory failure with hypoxia. Chest x-ray suggested pulmonary edema but not thought to be so on clinical examination. ABG was reviewed. His oxygenation has improved with BiPAP which will be continued for now. He could have thrown a clot into his lungs as he does have DVT. However, he denies any chest pains. Cannot do CT scan due to his renal failure. He already has an IVC filter. Continue to support him for now. Echocardiogram was ordered to look for RV strain. He did have an ECHO as recently as June. Moderate MR was noted. EF was normal at that time.  Bright red blood per rectum with possible melena as well. Patient has had intermittent episodes of melena and hematochezia. Per RN he had one large episode of BRBPR this morning around 5:00. Hemoglobin is surprisingly stable. Neither he nor his daughter know when was the last time he had a colonoscopy. He hasn't had any hematemesis in the hospital. He is on PPI twice a day which will be continued. Since he's had a few episodes of GI bleed we consulted gastroenterology. They did not  recommend any further testing.  Acute blood loss anemia. Hemoglobin has dropped some, but not significantly so. He was transfused one unit of blood for now.  Acute on chronic kidney disease stage III with non-anion gap metabolic acidosis BUN and creatinine elevated compared to his baseline. Monitor urine upper closely. He had been making less urine. He'll be  given fluid challenges. There could be an element of ATN as well due to hypotension. He is noted to have metabolic acidosis as well. CT scan of the abdomen done on September 9, did not show any hydronephrosis.  Metastatic disease noted on recent CT scan. This is most likely secondary to prostate cancer. He has abnormal LFTs. Family has not had a chance to discuss this with his outpatient providers.   Bilateral lower extremity DVT. He is status post IVC filter placement. Cannot anticoagulate due to GI bleeding issues.  Severe protein calorie malnutrition   The results of significant diagnostics from this hospitalization (including imaging, microbiology, ancillary and laboratory) are listed below for reference.    Significant Diagnostic Studies: Ct Abdomen Pelvis Wo Contrast  Result Date: 01/04/2016 CLINICAL DATA:  Abdominal pain.  No diarrhea or vomiting EXAM: CT ABDOMEN AND PELVIS WITHOUT CONTRAST TECHNIQUE: Multidetector CT imaging of the abdomen and pelvis was performed following the standard protocol without IV contrast. COMPARISON:  CT abdomen pelvis 11/15/2013, CT pelvis 05/22/2015 FINDINGS: Lower chest: Focal area of consolidation in the posterior right lower lobe. No pleural effusion. Heart is enlarged. Hepatobiliary: There are numerous hypo attenuating masses throughout the liver, which are new compared to the CT of 05/22/2015. The largest is located in the left hepatic lobe measures up to 7.3 cm in greatest transverse dimension. The gallbladder is nondistended. There is small volume perihepatic ascites. Pancreas: There are multiple calcifications of the atrophic pancreas. Spleen: Normal. Adrenals/Urinary Tract: Normal adrenal glands. There are multiple bilateral renal cysts. There is left-greater-than-right advanced cortical renal atrophy. Stomach/Bowel: No abnormal bowel dilatation. No bowel wall thickening or adjacent fat stranding to indicate acute inflammation. No abdominal fluid  collection. Vascular/Lymphatic: Mild aortic atherosclerotic calcification. No abdominal aortic aneurysm. Multiple subcentimeter retroperitoneal lymph nodes. Reproductive: Size of the prostate is normal. Seminal vesicles are unremarkable. Small amount of fluid within the left pelvis Musculoskeletal: Multilevel severe facet arthrosis and osteophytosis. No advanced bony canal stenosis. Bilateral sacroiliac bridging osteophytes. No lytic or blastic osseous lesions. There is a soft tissue attenuation focus within the anterior abdominal wall (series 2, image 35) measuring 16 x 8 mm. Other: No contributory non-categorized findings. IMPRESSION: 1. Diffuse hepatic metastatic disease, new from the prior examination. Small volume perihepatic ascites. 2. Soft tissue nodule within the subcutaneous fat of the anterior abdominal wall is also concerning for metastatic disease, though the appearance is somewhat nonspecific. 3. Focal area of consolidation in the right lower lobe. This could indicate developing infection. Electronically Signed   By: Ulyses Jarred M.D.   On: 01/04/2016 03:11   Dg Chest 2 View  Result Date: 01/03/2016 CLINICAL DATA:  82 year old male with shortness of breath EXAM: CHEST  2 VIEW COMPARISON:  Chest radiograph dated 05/24/2008 FINDINGS: There is mild cardiomegaly with mild central vascular congestion. No pulmonary edema. Mild eventration of the left hemidiaphragm with minimal left lung base atelectatic changes. There is no focal consolidation, pleural effusion, or pneumothorax. No acute osseous pathology identified. IMPRESSION: Mild cardiomegaly with mild vascular congestion. No focal consolidation or edema. Electronically Signed   By: Anner Crete M.D.   On: 01/03/2016 20:25  Ir Venogram Renal Uni Right  Result Date: 01/16/2016 INDICATION: Bilateral lower extremity DVT.  Gastrointestinal bleeding. EXAM: IVC FILTER,INFERIOR VENA CAVOGRAM MEDICATIONS: None. ANESTHESIA/SEDATION: Fentanyl 0 mcg  IV; Versed 0.5 mg IV Moderate Sedation Time:  0 minutes FLUOROSCOPY TIME:  Fluoroscopy Time: 3 minutes 12 seconds (654 mGy). COMPLICATIONS: None immediate. PROCEDURE: Informed written consent was obtained from the patient after a thorough discussion of the procedural risks, benefits and alternatives. All questions were addressed. Maximal Sterile Barrier Technique was utilized including caps, mask, sterile gowns, sterile gloves, sterile drape, hand hygiene and skin antiseptic. A timeout was performed prior to the initiation of the procedure. The right neck was prepped with Betadine in a sterile fashion, and a sterile drape was applied covering the operative field. A sterile gown and sterile gloves were used for the procedure. The right jugular vein vein was noted to be patent initially with ultrasound. Under sonographic guidance, a micropuncture needle was inserted into the right jugular vein (Ultrasound image documentation was performed). It was removed over an 018 wire which was up-sized to a [place]. The sheath was inserted over the wire and into the lower IVC. IVC carbon dioxide venography was performed. The catheter was retracted to the renal vein inflow and repeat IVC venography was performed. A An MPA catheter was then advanced through the sheath and the right renal vein was selected. Right renal venography was performed. The temporary filter was then deployed in the infrarenal IVC. The sheath was removed and hemostasis was achieved with direct pressure. FINDINGS: IVC venography confirms IVC bifurcation at L5 as well as the left renal vein inflow at L2. Right renal vein venography confirms renal vein inflow at L2. The final image demonstrates aid anomaly temporary IVC filter with its tip at mid L2. IMPRESSION: Successful infrarenal IVC filter placement. This is a temporary filter. It can be removed or remain in place to become permanent. Electronically Signed   By: Marybelle Killings M.D.   On: 01/16/2016 17:05    Ir Ivc Filter Plmt / S&i /img Guid/mod Sed  Result Date: 01/16/2016 INDICATION: Bilateral lower extremity DVT.  Gastrointestinal bleeding. EXAM: IVC FILTER,INFERIOR VENA CAVOGRAM MEDICATIONS: None. ANESTHESIA/SEDATION: Fentanyl 0 mcg IV; Versed 0.5 mg IV Moderate Sedation Time:  0 minutes FLUOROSCOPY TIME:  Fluoroscopy Time: 3 minutes 12 seconds (654 mGy). COMPLICATIONS: None immediate. PROCEDURE: Informed written consent was obtained from the patient after a thorough discussion of the procedural risks, benefits and alternatives. All questions were addressed. Maximal Sterile Barrier Technique was utilized including caps, mask, sterile gowns, sterile gloves, sterile drape, hand hygiene and skin antiseptic. A timeout was performed prior to the initiation of the procedure. The right neck was prepped with Betadine in a sterile fashion, and a sterile drape was applied covering the operative field. A sterile gown and sterile gloves were used for the procedure. The right jugular vein vein was noted to be patent initially with ultrasound. Under sonographic guidance, a micropuncture needle was inserted into the right jugular vein (Ultrasound image documentation was performed). It was removed over an 018 wire which was up-sized to a [place]. The sheath was inserted over the wire and into the lower IVC. IVC carbon dioxide venography was performed. The catheter was retracted to the renal vein inflow and repeat IVC venography was performed. A An MPA catheter was then advanced through the sheath and the right renal vein was selected. Right renal venography was performed. The temporary filter was then deployed in the infrarenal IVC. The sheath was removed  and hemostasis was achieved with direct pressure. FINDINGS: IVC venography confirms IVC bifurcation at L5 as well as the left renal vein inflow at L2. Right renal vein venography confirms renal vein inflow at L2. The final image demonstrates aid anomaly temporary IVC filter  with its tip at mid L2. IMPRESSION: Successful infrarenal IVC filter placement. This is a temporary filter. It can be removed or remain in place to become permanent. Electronically Signed   By: Marybelle Killings M.D.   On: 01/16/2016 17:05   Dg Chest Port 1 View  Result Date: 01/17/2016 CLINICAL DATA:  Acute onset of tachypnea.  Initial encounter. EXAM: PORTABLE CHEST 1 VIEW COMPARISON:  Chest radiograph performed 01/16/2016 FINDINGS: A small left pleural effusion is noted. Vascular congestion is seen. Increased interstitial markings raise concern for mild pulmonary edema. No pneumothorax identified. The cardiothymic silhouette is mildly enlarged. A right IJ line is noted ending about the distal SVC. No acute osseous abnormalities are seen. IMPRESSION: Small left pleural effusion noted. Increased interstitial markings raise concern for mild pulmonary edema. Vascular congestion and mild cardiomegaly. Electronically Signed   By: Garald Balding M.D.   On: 01/17/2016 23:21   Dg Chest Portable 1 View  Result Date: 01/16/2016 CLINICAL DATA:  Line placement.  Shortness of breath. EXAM: PORTABLE CHEST 1 VIEW COMPARISON:  12/30/2015 FINDINGS: Right central venous catheter placed with tip over the cavoatrial junction. No visible pneumothorax. Shallow inspiration with atelectasis in the lung bases. No blunting of costophrenic angles. Calcified and tortuous aorta. Cardiac enlargement without vascular congestion. Old right rib fractures. IMPRESSION: Right central venous catheter with tip over the cavoatrial junction. No pneumothorax. Shallow inspiration. Cardiac enlargement. Electronically Signed   By: Lucienne Capers M.D.   On: 01/16/2016 01:09   Dg Chest Port 1 View  Result Date: 01/06/2016 CLINICAL DATA:  Swollen legs.  Pain. EXAM: PORTABLE CHEST 1 VIEW COMPARISON:  01/13/2016 FINDINGS: Low volume chest. No convincing pneumonia or edema but limited behind the heart. Chronic cardiomegaly. No effusion or pneumothorax.  EKG leads create artifact across the chest IMPRESSION: 1. Limited low volume chest without edema or convincing pneumonia. 2. Chronic cardiomegaly. Electronically Signed   By: Monte Fantasia M.D.   On: 01/01/2016 19:07    Microbiology: Recent Results (from the past 240 hour(s))  Urine culture     Status: Abnormal   Collection Time: 12/30/2015  7:11 PM  Result Value Ref Range Status   Specimen Description URINE, CLEAN CATCH  Final   Special Requests NONE  Final   Culture <10,000 COLONIES/mL INSIGNIFICANT GROWTH (A)  Final   Report Status 01/16/2016 FINAL  Final  Blood culture (routine x 2)     Status: None (Preliminary result)   Collection Time: 01/05/2016  7:45 PM  Result Value Ref Range Status   Specimen Description BLOOD LEFT HAND  Final   Special Requests IN PEDIATRIC BOTTLE 3CC  Final   Culture NO GROWTH 2 DAYS  Final   Report Status PENDING  Incomplete  Blood culture (routine x 2)     Status: None (Preliminary result)   Collection Time: 01/12/2016  7:59 PM  Result Value Ref Range Status   Specimen Description BLOOD RIGHT HAND  Final   Special Requests IN PEDIATRIC BOTTLE 1CC  Final   Culture NO GROWTH 2 DAYS  Final   Report Status PENDING  Incomplete  MRSA PCR Screening     Status: None   Collection Time: 01/16/16  4:09 AM  Result Value Ref Range Status  MRSA by PCR NEGATIVE NEGATIVE Final    Comment:        The GeneXpert MRSA Assay (FDA approved for NASAL specimens only), is one component of a comprehensive MRSA colonization surveillance program. It is not intended to diagnose MRSA infection nor to guide or monitor treatment for MRSA infections.      Labs: Basic Metabolic Panel:  Recent Labs Lab 01/12/2016 1815 01/16/16 0400 01/17/16 2031 01-21-16 0231 01/21/16 0745  NA 133* 137 139 138 138  K 5.0 4.1 4.6 4.6 4.6  CL 100* 106 114* 110 112*  CO2 18* 19* 16* 16* 16*  GLUCOSE 141* 141* 138* 151* 140*  BUN 101* 91* 85* 85* 88*  CREATININE 3.13* 2.60* 2.78* 2.88*  2.95*  CALCIUM 8.4* 7.4* 7.3* 7.3* 7.4*  MG  --  2.3 2.4  --   --   PHOS  --  4.1  --   --   --    Liver Function Tests:  Recent Labs Lab 01/16/2016 1959  AST 150*  ALT 69*  ALKPHOS 198*  BILITOT 2.6*  PROT 6.3*  ALBUMIN 2.6*   CBC:  Recent Labs Lab 01/01/2016 1815 01/16/16 0400 01/17/16 0612 2016-01-21 0231 2016-01-21 1000  WBC 16.9* 15.7* 16.6* 15.5* 16.7*  NEUTROABS  --   --  15.3*  --   --   HGB 12.1* 9.6* 10.0* 8.9* 8.9*  HCT 37.2* 29.7* 31.0* 27.9* 27.4*  MCV 89.9 90.0 89.9 90.3 89.8  PLT 133* 121* 100* 83* 67*   Cardiac Enzymes:  Recent Labs Lab 01/16/16 0400 01/17/16 2031 01-21-2016 0231 01-21-16 0745  TROPONINI 0.07* 0.08* 0.09* 0.08*   CBG:  Recent Labs Lab 01/03/2016 1825 01/17/16 2353  GLUCAP 113* 136*   Urinalysis    Component Value Date/Time   COLORURINE YELLOW 01/13/2016 1848   APPEARANCEUR CLEAR 01/26/2016 1848   LABSPEC 1.013 01/09/2016 1848   PHURINE 5.0 01/14/2016 1848   GLUCOSEU NEGATIVE 01/25/2016 1848   HGBUR NEGATIVE 01/08/2016 1848   BILIRUBINUR NEGATIVE 01/16/2016 1848   KETONESUR NEGATIVE 12/31/2015 1848   PROTEINUR NEGATIVE 01/07/2016 1848   UROBILINOGEN 0.2 05/24/2008 1048   NITRITE NEGATIVE 01/03/2016 1848   LEUKOCYTESUR NEGATIVE 01/16/2016 1848      Kylee Nardozzi  [facility] 01/21/16, 3:06 PM

## 2016-01-28 NOTE — Progress Notes (Signed)
[facility] PROGRESS NOTE  Shawn Booker [id number] DOB: 1923-02-12 DOA: 01/24/2016  PCP: Elyn Peers, MD  Brief History/Interval Summary: 82 year old African-American male with a has medical history of bladder and prostate cancer , developed bilateral foot pain for which he went to see his primary care provider. There was some concern for vascular compromise, plus the patient was noted to have hypotension and was somnolent. So he was sent over to the emergency department for further management. Patient was self seen by vascular surgery. There was no indication for any intervention. He was found to be hypotensive and was given fluids and pressors. Patient was admitted to the intensive care unit. CT scan of the abdomen showed metastatic disease presumably from his prostate cancer. He also was found to have bilateral DVTs. Patient underwent IVC filter placement. Patient also was noted to have black stools and there were also a few episodes of bright red blood in his stool.  Reason for Visit: GI bleed  Consultants: Critical care medicine. Vascular surgery. Interventional radiology. Gastroenterology.  Procedures: IVC filter placement. Central line placement.  Antibiotics: Vancomycin and Zosyn 9/19  Subjective/Interval History: Patient on the BiPAP this morning. He is unable to communicate appropriately. However, he denies any chest pain, shortness of breath. Denies any nausea, vomiting. Denies any abdominal pain.  ROS: As above  Objective:  Vital Signs  Vitals:   29-Jan-2016 1115 01-29-2016 1130 01-29-16 1144 01/29/16 1208  BP: (!) 96/51 96/71    Pulse: (!) 55 (!) 57    Resp: (!) 28 (!) 28    Temp: 97.5 F (36.4 C)  97.6 F (36.4 C)   TempSrc: Axillary  Axillary   SpO2: (!) 89% 91%  99%  Weight:        Intake/Output Summary (Last 24 hours) at 01/29/16 1211 Last data filed at January 29, 2016 1115  Gross per 24 hour  Intake             3330 ml  Output              995 ml    Net             2335 ml   Filed Weights   01/16/16 0417 01/17/16 0500 01/29/2016 0500  Weight: 70.8 kg (156 lb 1.4 oz) 70.9 kg (156 lb 4.9 oz) 77.1 kg (169 lb 15.6 oz)    General appearance: alert, appears stated age, distracted and no distress Head: Normocephalic, without obvious abnormality, atraumatic Resp: Diminished air entry at the bases but mostly clear to auscultation. No wheezing, crackles or rhonchi. Cardio: S1, S2 is tachycardic. Regular. No S3, S4. No rubs, or bruit. Systolic murmur appreciated over the precordium. GI: Abdomen is soft. Nontender. Hepatomegaly is appreciated. Bowel sounds are present. Nontender. Extremities: edema bilateral lower extremities Pulses: Pulses are poorly palpable in the feet. Skin: Skin color, texture, turgor normal. No rashes or lesions Neurologic: Awake and alert. Oriented to place. Somewhat distracted. No focal neurological deficits are appreciated.  Lab Results:  Data Reviewed: I have personally reviewed following labs and imaging studies  CBC:  Recent Labs Lab 12/31/2015 1815 01/16/16 0400 01/17/16 0612 Jan 29, 2016 0231 January 29, 2016 1000  WBC 16.9* 15.7* 16.6* 15.5* 16.7*  NEUTROABS  --   --  15.3*  --   --   HGB 12.1* 9.6* 10.0* 8.9* 8.9*  HCT 37.2* 29.7* 31.0* 27.9* 27.4*  MCV 89.9 90.0 89.9 90.3 89.8  PLT 133* 121* 100* 83* 67*    Basic Metabolic Panel:  Recent  Labs Lab 01/01/2016 1815 01/16/16 0400 01/17/16 2031 02/16/2016 0231 16-Feb-2016 0745  NA 133* 137 139 138 138  K 5.0 4.1 4.6 4.6 4.6  CL 100* 106 114* 110 112*  CO2 18* 19* 16* 16* 16*  GLUCOSE 141* 141* 138* 151* 140*  BUN 101* 91* 85* 85* 88*  CREATININE 3.13* 2.60* 2.78* 2.88* 2.95*  CALCIUM 8.4* 7.4* 7.3* 7.3* 7.4*  MG  --  2.3 2.4  --   --   PHOS  --  4.1  --   --   --     GFR: Estimated Creatinine Clearance: 15.6 mL/min (by C-G formula based on SCr of 2.95 mg/dL (H)).  Liver Function Tests:  Recent Labs Lab 01/14/2016 1959  AST 150*  ALT 69*  ALKPHOS  198*  BILITOT 2.6*  PROT 6.3*  ALBUMIN 2.6*     Coagulation Profile:  Recent Labs Lab 01/16/16 0400  INR 2.10    Cardiac Enzymes:  Recent Labs Lab 01/16/16 0400 01/17/16 2031 02/16/2016 0231 February 16, 2016 0745  TROPONINI 0.07* 0.08* 0.09* 0.08*     CBG:  Recent Labs Lab 01/10/2016 1825 01/17/16 2353  GLUCAP 113* 136*     Recent Results (from the past 240 hour(s))  Urine culture     Status: Abnormal   Collection Time: 01/27/2016  7:11 PM  Result Value Ref Range Status   Specimen Description URINE, CLEAN CATCH  Final   Special Requests NONE  Final   Culture <10,000 COLONIES/mL INSIGNIFICANT GROWTH (A)  Final   Report Status 01/16/2016 FINAL  Final  Blood culture (routine x 2)     Status: None (Preliminary result)   Collection Time: 01/21/2016  7:45 PM  Result Value Ref Range Status   Specimen Description BLOOD LEFT HAND  Final   Special Requests IN PEDIATRIC BOTTLE 3CC  Final   Culture NO GROWTH 2 DAYS  Final   Report Status PENDING  Incomplete  Blood culture (routine x 2)     Status: None (Preliminary result)   Collection Time: 01/24/2016  7:59 PM  Result Value Ref Range Status   Specimen Description BLOOD RIGHT HAND  Final   Special Requests IN PEDIATRIC BOTTLE 1CC  Final   Culture NO GROWTH 2 DAYS  Final   Report Status PENDING  Incomplete  MRSA PCR Screening     Status: None   Collection Time: 01/16/16  4:09 AM  Result Value Ref Range Status   MRSA by PCR NEGATIVE NEGATIVE Final    Comment:        The GeneXpert MRSA Assay (FDA approved for NASAL specimens only), is one component of a comprehensive MRSA colonization surveillance program. It is not intended to diagnose MRSA infection nor to guide or monitor treatment for MRSA infections.       Radiology Studies: Ir Venogram Renal Uni Right  Result Date: 01/16/2016 INDICATION: Bilateral lower extremity DVT.  Gastrointestinal bleeding. EXAM: IVC FILTER,INFERIOR VENA CAVOGRAM MEDICATIONS: None.  ANESTHESIA/SEDATION: Fentanyl 0 mcg IV; Versed 0.5 mg IV Moderate Sedation Time:  0 minutes FLUOROSCOPY TIME:  Fluoroscopy Time: 3 minutes 12 seconds (654 mGy). COMPLICATIONS: None immediate. PROCEDURE: Informed written consent was obtained from the patient after a thorough discussion of the procedural risks, benefits and alternatives. All questions were addressed. Maximal Sterile Barrier Technique was utilized including caps, mask, sterile gowns, sterile gloves, sterile drape, hand hygiene and skin antiseptic. A timeout was performed prior to the initiation of the procedure. The right neck was prepped with Betadine in a sterile  fashion, and a sterile drape was applied covering the operative field. A sterile gown and sterile gloves were used for the procedure. The right jugular vein vein was noted to be patent initially with ultrasound. Under sonographic guidance, a micropuncture needle was inserted into the right jugular vein (Ultrasound image documentation was performed). It was removed over an 018 wire which was up-sized to a [place]. The sheath was inserted over the wire and into the lower IVC. IVC carbon dioxide venography was performed. The catheter was retracted to the renal vein inflow and repeat IVC venography was performed. A An MPA catheter was then advanced through the sheath and the right renal vein was selected. Right renal venography was performed. The temporary filter was then deployed in the infrarenal IVC. The sheath was removed and hemostasis was achieved with direct pressure. FINDINGS: IVC venography confirms IVC bifurcation at L5 as well as the left renal vein inflow at L2. Right renal vein venography confirms renal vein inflow at L2. The final image demonstrates aid anomaly temporary IVC filter with its tip at mid L2. IMPRESSION: Successful infrarenal IVC filter placement. This is a temporary filter. It can be removed or remain in place to become permanent. Electronically Signed   By: Marybelle Killings M.D.   On: 01/16/2016 17:05   Ir Ivc Filter Plmt / S&i /img Guid/mod Sed  Result Date: 01/16/2016 INDICATION: Bilateral lower extremity DVT.  Gastrointestinal bleeding. EXAM: IVC FILTER,INFERIOR VENA CAVOGRAM MEDICATIONS: None. ANESTHESIA/SEDATION: Fentanyl 0 mcg IV; Versed 0.5 mg IV Moderate Sedation Time:  0 minutes FLUOROSCOPY TIME:  Fluoroscopy Time: 3 minutes 12 seconds (654 mGy). COMPLICATIONS: None immediate. PROCEDURE: Informed written consent was obtained from the patient after a thorough discussion of the procedural risks, benefits and alternatives. All questions were addressed. Maximal Sterile Barrier Technique was utilized including caps, mask, sterile gowns, sterile gloves, sterile drape, hand hygiene and skin antiseptic. A timeout was performed prior to the initiation of the procedure. The right neck was prepped with Betadine in a sterile fashion, and a sterile drape was applied covering the operative field. A sterile gown and sterile gloves were used for the procedure. The right jugular vein vein was noted to be patent initially with ultrasound. Under sonographic guidance, a micropuncture needle was inserted into the right jugular vein (Ultrasound image documentation was performed). It was removed over an 018 wire which was up-sized to a [place]. The sheath was inserted over the wire and into the lower IVC. IVC carbon dioxide venography was performed. The catheter was retracted to the renal vein inflow and repeat IVC venography was performed. A An MPA catheter was then advanced through the sheath and the right renal vein was selected. Right renal venography was performed. The temporary filter was then deployed in the infrarenal IVC. The sheath was removed and hemostasis was achieved with direct pressure. FINDINGS: IVC venography confirms IVC bifurcation at L5 as well as the left renal vein inflow at L2. Right renal vein venography confirms renal vein inflow at L2. The final image demonstrates  aid anomaly temporary IVC filter with its tip at mid L2. IMPRESSION: Successful infrarenal IVC filter placement. This is a temporary filter. It can be removed or remain in place to become permanent. Electronically Signed   By: Marybelle Killings M.D.   On: 01/16/2016 17:05   Dg Chest Port 1 View  Result Date: 01/17/2016 CLINICAL DATA:  Acute onset of tachypnea.  Initial encounter. EXAM: PORTABLE CHEST 1 VIEW COMPARISON:  Chest radiograph performed 01/16/2016  FINDINGS: A small left pleural effusion is noted. Vascular congestion is seen. Increased interstitial markings raise concern for mild pulmonary edema. No pneumothorax identified. The cardiothymic silhouette is mildly enlarged. A right IJ line is noted ending about the distal SVC. No acute osseous abnormalities are seen. IMPRESSION: Small left pleural effusion noted. Increased interstitial markings raise concern for mild pulmonary edema. Vascular congestion and mild cardiomegaly. Electronically Signed   By: Garald Balding M.D.   On: 01/17/2016 23:21     Medications:  Scheduled: . sodium chloride   Intravenous Once  . feeding supplement (ENSURE ENLIVE)  237 mL Oral BID BM  . pantoprazole (PROTONIX) IV  40 mg Intravenous Q12H  . piperacillin-tazobactam (ZOSYN)  IV  2.25 g Intravenous Q8H  . vancomycin  1,000 mg Intravenous Q48H   Continuous: . sodium chloride 100 mL/hr at 02-04-16 1100   [id number] chloride  Assessment/Plan:  Principal Problem:   Hypovolemic shock (HCC) Active Problems:   Prostate cancer (HCC)   Chronic kidney disease, stage III (moderate)   Sepsis ([place])   AKI (acute kidney injury) ([place])   Metastases to the liver (HCC)   Metabolic acidosis   BRBPR (bright red blood per rectum)   Acute blood loss anemia   DVT (deep venous thrombosis) (HCC)    Shock, possibly hypovolemic versus septic Patient required pressors briefly. He did have elevated pro-calcitonin level. He did have an elevated lactic acid level, which  improved. Patient was placed on broad-spectrum antibiotics with vancomycin and Zosyn. Blood cultures are negative so far. Blood pressure has improved, though remains low. There might be some element of GI bleed, which is also causing low blood pressure. Continue to monitor closely for now. He is noted to be tachycardic, which could be due to some degree of hypovolemia. Perhaps it will improve with fluid bolus and blood infusion. Continue to monitor closely.  Acute respiratory failure with hypoxia. Chest x-ray suggested pulmonary edema but not thought to be so on clinical examination. ABG was reviewed. His oxygenation has improved with BiPAP which will be continued for now. He could have thrown a clot into his lungs as he does have DVT. However, he denies any chest pains. Cannot do CT scan due to his renal failure. He already has an IVC filter. Continue to support him for now. Consider echocardiogram to look for RV strain. He did have an ECHO as recently as June. Moderate MR was noted. EF was normal at that time.  Bright red blood per rectum with possible melena as well. Patient has had intermittent episodes of melena and hematochezia. Per RN he had one large episode of BRBPR this morning around 5:00. Hemoglobin is surprisingly stable. Neither he nor his daughter know when was the last time he had a colonoscopy. He hasn't had any hematemesis in the hospital. He is on PPI twice a day which will be continued. Since he's had a few episodes of GI bleed we will go ahead and consult gastroenterology.  Acute blood loss anemia. Hemoglobin has dropped some, but not significantly so. Proceed with the transfusion of one unit of blood for now. Monitor CBCs closely.  Acute on chronic kidney disease stage III with non-anion gap metabolic acidosis BUN and creatinine elevated compared to his baseline. Monitor urine upper closely. He has been making less urine. He'll be given fluid challenges. There could be an element of  ATN as well due to hypotension. He is noted to have metabolic acidosis as well. Repeat labs tomorrow morning. He  may need bicarbonate. CT scan of the abdomen done on September 9, did not show any hydronephrosis. Consider renal ultrasound.  Metastatic disease noted on recent CT scan. This is most likely secondary to prostate cancer. He has abnormal LFTs. Family has not had a chance to discuss this with his outpatient providers. Depending on how the patient is clinically in the hospital, we may need to consult palliative medicine.  Bilateral lower extremity DVT. He is status post IVC filter placement. Cannot anticoagulate due to GI bleeding issues.  DVT Prophylaxis: No heparin products due to bleeding issues. Patient also has DVT also unable to use SCDs. Consider TED stockings.    Code Status: Discussed in detail with the patient as well as his daughter. He is a full code for now.  Family Communication: Discussed with the patient as well as his daughter, Justinpaul Salone at [phone].  Disposition Plan: Remain in step down for today.    LOS: 2 days   [facility] Pager [phone] 02/10/16, 12:11 PM  If 7PM-7AM, please contact night-coverage at [email or web address], password [facility]

## 2016-01-28 NOTE — Progress Notes (Signed)
PT Cancellation Note  Patient Details Name: ODDIE HECKLE MRN: [id number] DOB: 1922/08/21   Cancelled Treatment:    Reason Eval/Treat Not Completed: Medical issues which prohibited therapy (lethargic on bipap and hold per RN)   Lanetta Inch Beth 02-04-16, 7:45 AM Elwyn Reach, [place]

## 2016-01-28 NOTE — Progress Notes (Signed)
I entered pts room to replace covers and witnessed respiratory arrest, pt then became bradycardic and pulseless. CPR was initiated. Code called by CCM physician, see code blue sheet. Time of death, 1459 called by Dr. [place] Pink. Pt noted to be asystolic, apneic, with no respirations or BP. CDS notified. Family is at the bedside. [place] paged.

## 2016-01-28 DEATH — deceased

## 2018-01-31 IMAGING — CR DG CHEST 2V
3 series · 3 of 3 positions shown · non-contrast
Comparison: Chest radiograph dated 05/24/2008

CLINICAL DATA: [AGE] male with shortness of breath

EXAM:
CHEST  2 VIEW

[chest lat]
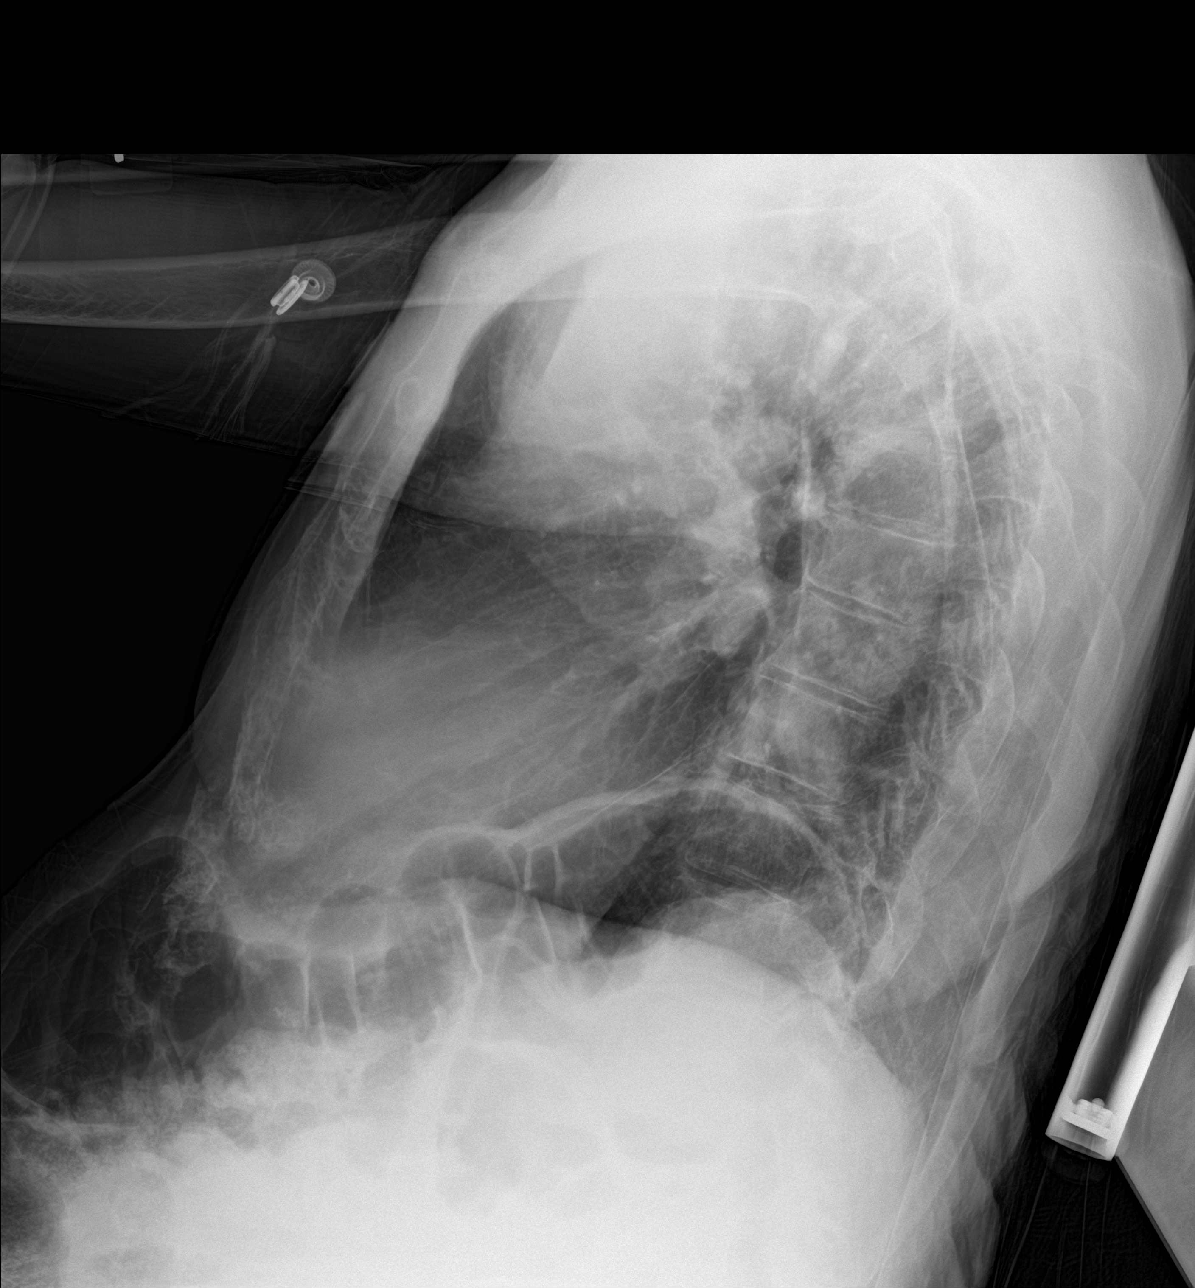

[chest ap (1 of 2)]
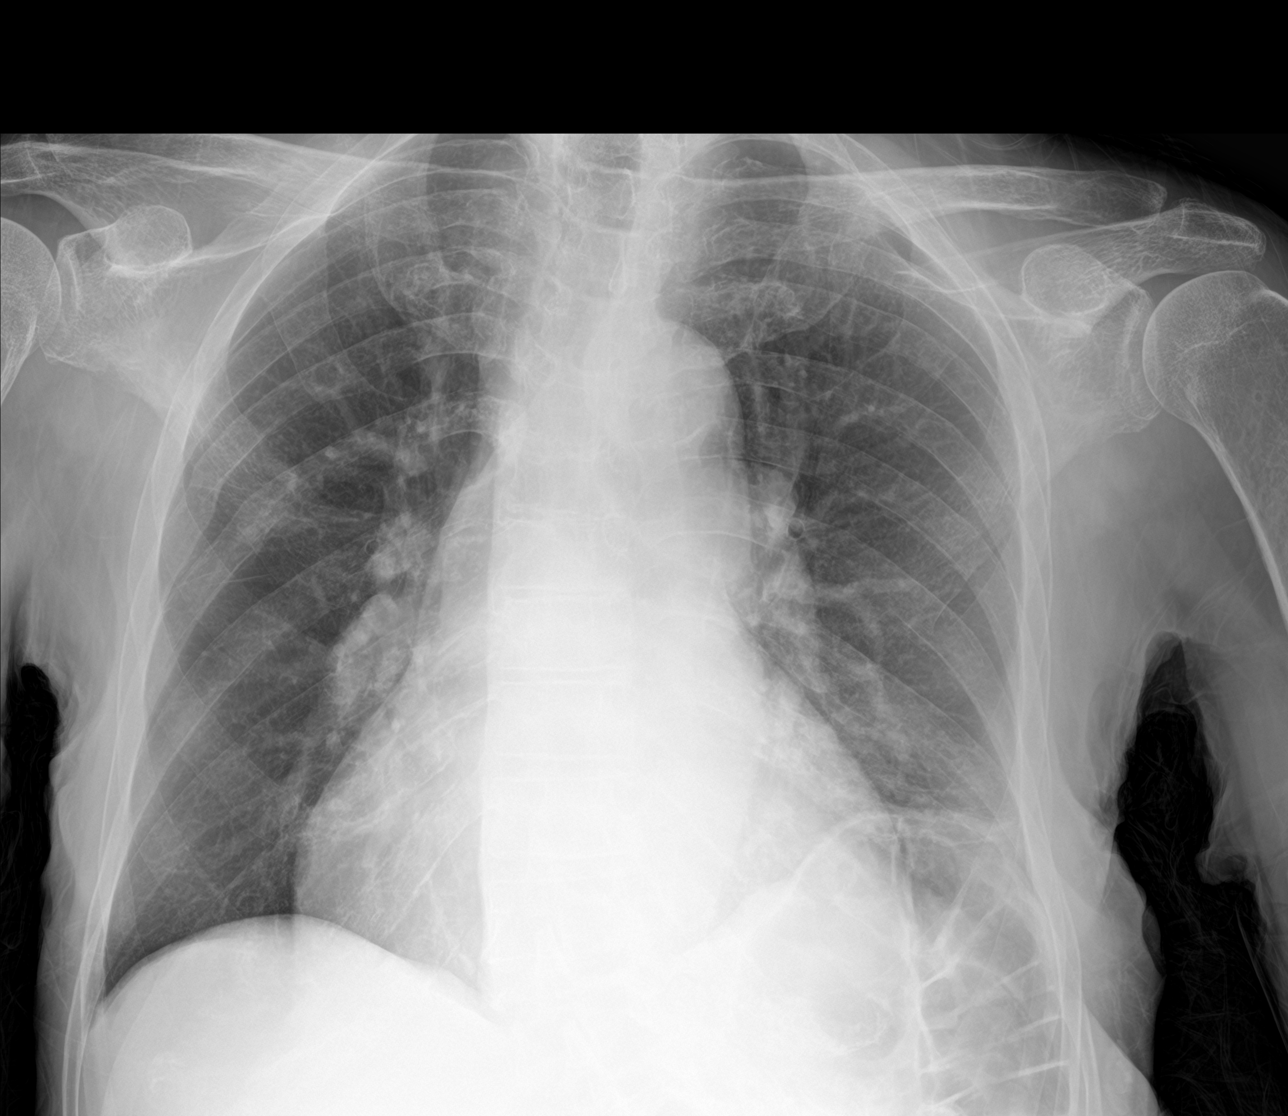

[chest ap (2 of 2)]
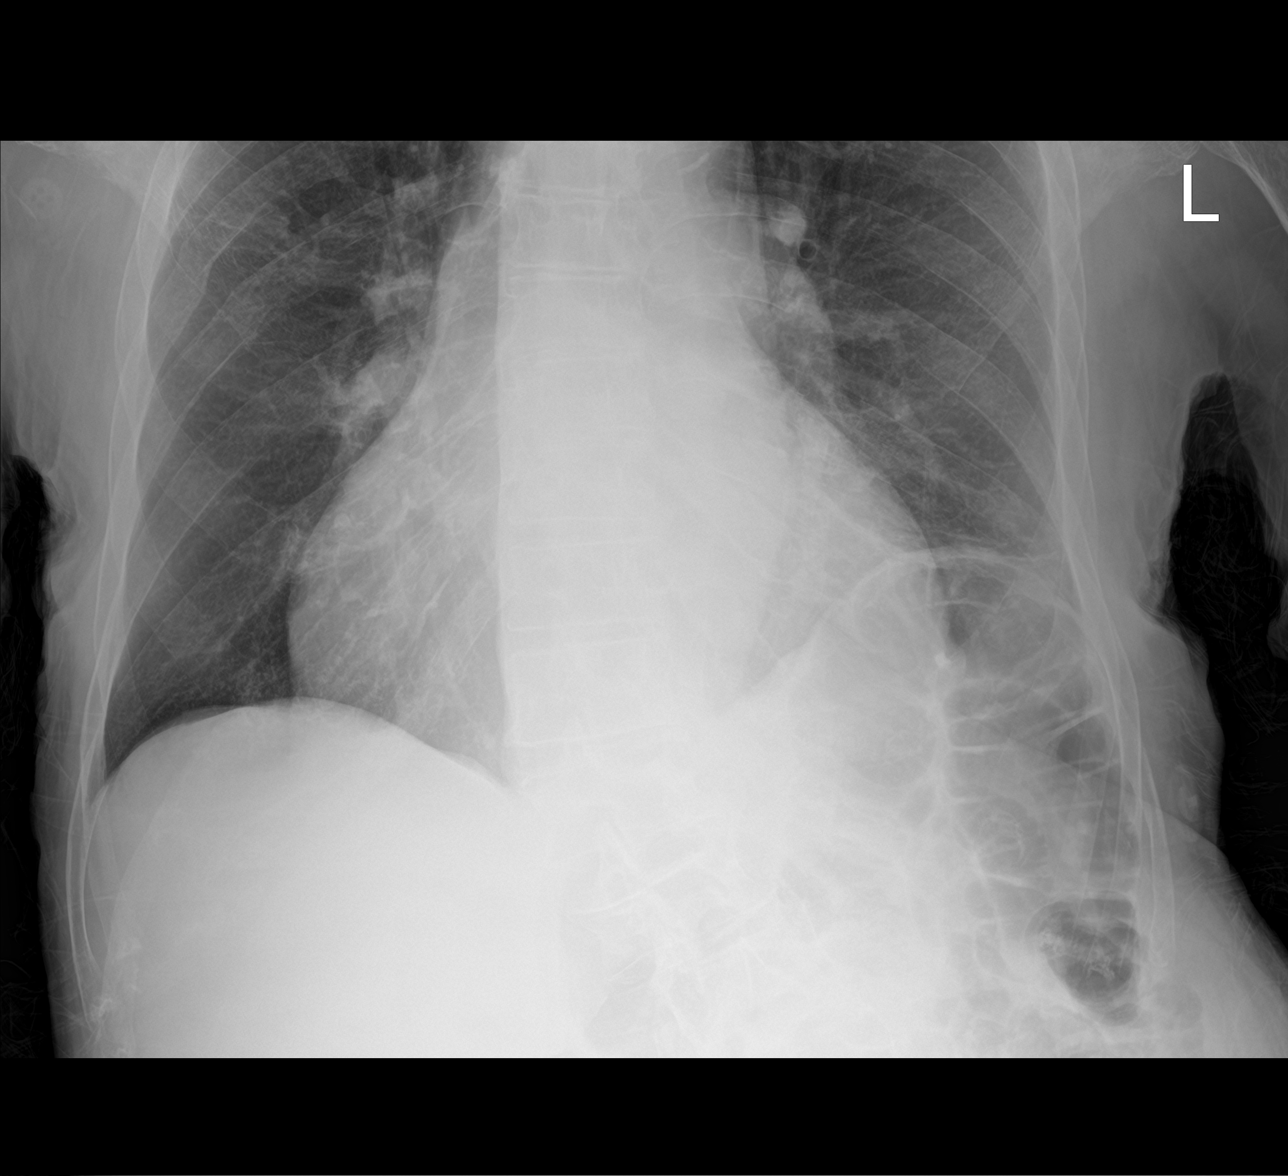

[3 of 3 positions shown; findings below may reference images not displayed]

FINDINGS: There is mild cardiomegaly with mild central vascular congestion. No
pulmonary edema. Mild eventration of the left hemidiaphragm with
minimal left lung base atelectatic changes. There is no focal
consolidation, pleural effusion, or pneumothorax. No acute osseous
pathology identified.
IMPRESSION: Mild cardiomegaly with mild vascular congestion. No focal
consolidation or edema.
